# Patient Record
Sex: Male | Born: 1970 | Race: Black or African American | Hispanic: No | Marital: Single | Smoking: Current every day smoker
Health system: Southern US, Community
[De-identification: ages and names within clinical notes are randomized; demographics above are authoritative.]

## PROBLEM LIST (undated history)

## (undated) DIAGNOSIS — E119 Type 2 diabetes mellitus without complications: Secondary | ICD-10-CM

## (undated) DIAGNOSIS — E559 Vitamin D deficiency, unspecified: Secondary | ICD-10-CM

## (undated) DIAGNOSIS — G479 Sleep disorder, unspecified: Secondary | ICD-10-CM

## (undated) DIAGNOSIS — B20 Human immunodeficiency virus [HIV] disease: Secondary | ICD-10-CM

## (undated) DIAGNOSIS — I10 Essential (primary) hypertension: Secondary | ICD-10-CM

## (undated) DIAGNOSIS — E782 Mixed hyperlipidemia: Secondary | ICD-10-CM

## (undated) DIAGNOSIS — I509 Heart failure, unspecified: Secondary | ICD-10-CM

## (undated) DIAGNOSIS — I6502 Occlusion and stenosis of left vertebral artery: Secondary | ICD-10-CM

## (undated) DIAGNOSIS — E291 Testicular hypofunction: Secondary | ICD-10-CM

## (undated) HISTORY — DX: Human immunodeficiency virus (HIV) disease: B20

## (undated) HISTORY — DX: Heart failure, unspecified: I50.9

## (undated) HISTORY — DX: Mixed hyperlipidemia: E78.2

## (undated) HISTORY — DX: Testicular hypofunction: E29.1

## (undated) HISTORY — PX: CHOLECYSTECTOMY: SHX55

## (undated) HISTORY — DX: Essential (primary) hypertension: I10

## (undated) HISTORY — DX: Type 2 diabetes mellitus without complications: E11.9

## (undated) HISTORY — DX: Occlusion and stenosis of left vertebral artery: I65.02

## (undated) HISTORY — DX: Sleep disorder, unspecified: G47.9

## (undated) HISTORY — DX: Vitamin D deficiency, unspecified: E55.9

---

## 2020-07-26 ENCOUNTER — Other Ambulatory Visit: Payer: Self-pay

## 2020-07-26 DIAGNOSIS — B20 Human immunodeficiency virus [HIV] disease: Secondary | ICD-10-CM

## 2020-07-27 ENCOUNTER — Other Ambulatory Visit (HOSPITAL_COMMUNITY)
Admission: RE | Admit: 2020-07-27 | Discharge: 2020-07-27 | Disposition: A | Discharge status: Home / Self Care | Payer: No Typology Code available for payment source | Source: Ambulatory Visit | Attending: Internal Medicine | Admitting: Internal Medicine

## 2020-07-27 ENCOUNTER — Other Ambulatory Visit: Payer: Self-pay

## 2020-07-27 ENCOUNTER — Other Ambulatory Visit: Payer: No Typology Code available for payment source

## 2020-07-27 DIAGNOSIS — B20 Human immunodeficiency virus [HIV] disease: Secondary | ICD-10-CM | POA: Diagnosis present

## 2020-07-28 ENCOUNTER — Telehealth: Payer: Self-pay

## 2020-07-28 LAB — URINALYSIS
Bilirubin Urine: NEGATIVE
Hgb urine dipstick: NEGATIVE
Ketones, ur: NEGATIVE
Leukocytes,Ua: NEGATIVE
Nitrite: NEGATIVE
Specific Gravity, Urine: 1.03 (ref 1.001–1.03)
pH: 6 (ref 5.0–8.0)

## 2020-07-28 LAB — URINE CYTOLOGY ANCILLARY ONLY
Chlamydia: NEGATIVE
Comment: NEGATIVE
Comment: NORMAL
Neisseria Gonorrhea: NEGATIVE

## 2020-07-28 LAB — T-HELPER CELL (CD4) - (RCID CLINIC ONLY)
CD4 % Helper T Cell: 9 % — ABNORMAL LOW (ref 33–65)
CD4 T Cell Abs: 208 /uL — ABNORMAL LOW (ref 400–1790)

## 2020-07-28 NOTE — Telephone Encounter (Signed)
[facility] calling with critical lab values :  Cholesterol : 621 Triglyceride : 7,333  Glucose:  492  Patient has an appointment scheduled 08-10-2020  Please advise  Eloise Mula Gorden Harms, RN

## 2020-07-28 NOTE — Telephone Encounter (Signed)
I spoke with patient who states he had type two diabetes and takes Lantus insulin.  He is not having any symptoms other than being worried about his HIV since he has been without medications for months.  He recently moved to the area and had issues with transferring to our office.  He states he just took a new job and works from home and would not want to be admitted to the hospital for fear of missing work. Per Dr Renold Don , if patient is without symptoms a emergency visit is not needed but he will need to see a provider tomorrow.  Patient was advised to go immediately to emergency room if he started to have any abnormal symptoms or developed  vomiting, nausea, shortness of breath, confusion, frequent urination or increased glucose levels due to his risk of diabetic ketoacidosis .    Laurell Josephs, RN

## 2020-07-28 NOTE — Telephone Encounter (Deleted)
I spoke to

## 2020-07-29 ENCOUNTER — Telehealth: Payer: Self-pay

## 2020-07-29 ENCOUNTER — Telehealth: Payer: Self-pay | Admitting: *Deleted

## 2020-07-29 ENCOUNTER — Other Ambulatory Visit: Payer: Self-pay

## 2020-07-29 ENCOUNTER — Ambulatory Visit (INDEPENDENT_AMBULATORY_CARE_PROVIDER_SITE_OTHER): Payer: No Typology Code available for payment source | Admitting: Internal Medicine

## 2020-07-29 ENCOUNTER — Encounter: Payer: Self-pay | Admitting: Internal Medicine

## 2020-07-29 VITALS — BP 202/128 | HR 63 | Temp 98.5°F | Wt 184.0 lb

## 2020-07-29 DIAGNOSIS — B20 Human immunodeficiency virus [HIV] disease: Secondary | ICD-10-CM

## 2020-07-29 DIAGNOSIS — Z23 Encounter for immunization: Secondary | ICD-10-CM | POA: Diagnosis not present

## 2020-07-29 DIAGNOSIS — IMO0002 Reserved for concepts with insufficient information to code with codable children: Secondary | ICD-10-CM

## 2020-07-29 DIAGNOSIS — E1165 Type 2 diabetes mellitus with hyperglycemia: Secondary | ICD-10-CM

## 2020-07-29 DIAGNOSIS — E1129 Type 2 diabetes mellitus with other diabetic kidney complication: Secondary | ICD-10-CM

## 2020-07-29 HISTORY — DX: Human immunodeficiency virus (HIV) disease: B20

## 2020-07-29 MED ORDER — ATORVASTATIN CALCIUM 80 MG PO TABS: 80.0000 mg | ORAL_TABLET | Freq: Every day | ORAL | 1 refills | Status: DC

## 2020-07-29 MED ORDER — PENLET II REPLACEMENT CAP MISC: 1.0000 | Freq: Every day | 0 refills | Status: DC

## 2020-07-29 MED ORDER — LISINOPRIL 40 MG PO TABS: 40.0000 mg | ORAL_TABLET | Freq: Every day | ORAL | 1 refills | Status: DC

## 2020-07-29 MED ORDER — BICTEGRAVIR-EMTRICITAB-TENOFOV 50-200-25 MG PO TABS: 1.0000 | ORAL_TABLET | Freq: Every day | ORAL | 11 refills | Status: DC

## 2020-07-29 MED ORDER — FENOFIBRATE 160 MG PO TABS: 160.0000 mg | ORAL_TABLET | Freq: Every day | ORAL | 1 refills | Status: DC

## 2020-07-29 MED ORDER — CARVEDILOL 25 MG PO TABS: 25.0000 mg | ORAL_TABLET | Freq: Two times a day (BID) | ORAL | 1 refills | Status: AC

## 2020-07-29 MED ORDER — GLIPIZIDE 5 MG PO TABS: 5.0000 mg | ORAL_TABLET | Freq: Every day | ORAL | 1 refills | Status: DC

## 2020-07-29 NOTE — Telephone Encounter (Signed)
[facility]'s [facility]antus needle. RN spoke to pharmacist, relayed that per chart, Dr Luciana Axe was providing 1 time fill to bridge him to primary care. Pharmacist took verbal clarification of prescription over the phone, will get the equipment that the patient needs. Andree Coss, RN

## 2020-07-29 NOTE — Telephone Encounter (Signed)
[facility] Patient Advocate Encounter   Was successful in obtaining a [employer] copay card for Biktarvy.  This copay card will make the patients copay $0.00.  I have spoken with the patient.         Shalayah Beagley, CPhT [facility] Phone: [phone] Fax:  [phone]  

## 2020-07-29 NOTE — Progress Notes (Signed)
Patient ID: Kevin Burke, male    DOB: 1971/02/22, 50 y.o.   MRN: [id number]  Reason for visit: to establish care as a new patient with HIV  HPI:   Patient was first diagnosed in 2002.  He was tested as part of routine screening.  The CD4 count is 208, viral load pending.  There have been no associated symptoms.  He was diagnosed originally routinely with no symptoms at that time.  No history of OIs and denies any history of syphilis, GC or chlamydia.  He endorses MSM activity.  He has been on medication essentially since his diagnosis and most recently has been on Biktarvy.  He is unsure when he started that.  He has had no issues with Biktarvy and has remained on it.  His HIV antibody is positive.  His last visit to ID was in April 2021, reviewed after the visit.  His CD4 at that time was 202 and his viral load was 345 copies, which was similar to his previous check.  He believes his viral load has remained in that range and does not recall ever getting any intensification of his ARVs.  He previously was on Mepron for OI prophylaxis but was stopped at the April visit.  He endorses excellent compliance though also asked about Cabenuva to help him stay on medication.     He also has other significant medical problems and has not been to his PCP since April 2021.  He was diagnosed with DM2 2 years ago and has been on Lantus 18 units a day and his last Hgb A1c per the record was 13.  He previously took metformin but stopped due to GI upset.  He also has diastolic CHF, mitral regurgitation, CAD, hypertriglyceridemia (TG over 7,000 here), HTN, hyperlipidemia, pancreatic mass and is a smoker.  He has been on lisinopril but not taking his other medications.    PMH: HTN, CAD, CHF, hypertriglyceridemia, hyperlipidema, DM  Prior to Admission medications   Medication Sig Start Date End Date Taking? Authorizing Provider  atorvastatin (LIPITOR) 80 MG tablet Take 1 tablet (80 mg total) by mouth daily. 07/29/20  Yes  Aimi Essner, Belia Heman, MD  bictegravir-emtricitabine-tenofovir AF (BIKTARVY) 50-200-25 MG TABS tablet Take 1 tablet by mouth daily. 07/29/20  Yes Jaymere Alen, Belia Heman, MD  carvedilol (COREG) 25 MG tablet Take 1 tablet (25 mg total) by mouth 2 (two) times daily with a meal. 07/29/20  Yes Mccauley Diehl, Belia Heman, MD  fenofibrate 160 MG tablet Take 1 tablet (160 mg total) by mouth daily. 07/29/20  Yes Wyatt Thorstenson, Belia Heman, MD  glipiZIDE (GLUCOTROL) 5 MG tablet Take 1 tablet (5 mg total) by mouth daily before breakfast. 07/29/20  Yes Dollie Mayse, Belia Heman, MD  Lancets Misc. (PENLET II REPLACEMENT CAP) MISC 1 each by Does not apply route daily. 07/29/20  Yes Linzie Boursiquot, Belia Heman, MD  lisinopril (ZESTRIL) 40 MG tablet Take 1 tablet (40 mg total) by mouth daily. 07/29/20  Yes Kerwin Augustus, Belia Heman, MD    [facility]: mother with hyperlipidemia, HTN, CVA, MI   Review of Systems Constitutional: negative for sweats, fatigue, malaise and anorexia Respiratory: negative for cough or sputum Gastrointestinal: negative for nausea and diarrhea Integument/breast: negative for rash Musculoskeletal: negative for myalgias and arthralgias All other systems reviewed and are negative    CONSTITUTIONAL:in no apparent distress  Vitals:   07/29/20 1012  BP: (!) 202/128  Pulse: 63  Temp: 98.5 F (36.9 C)   EYES: anicteric HENT: no thrush CARD:Cor RRR RESP:clear  SH:FWYOV sounds are normal, liver is not enlarged, spleen is not enlarged MS:no pedal edema noted SKIN: no rash NEURO: non-focal  No results found for: HIV1RNAQUANT No components found for: HIV1GENOTYPRPLUS No components found for: THELPERCELL  Assessment: new patient here with established HIV.  Discussed with patient treatment options and side effects, benefits of treatment, long term outcomes.  I discussed the severity of untreated HIV including higher cancer risk, opportunistic infections, renal failure.  Also discussed needing to use condoms, partner disclosure, necessary vaccines, blood monitoring.   All questions answered.  He is interested in [place] and understands that with commercial insurance coverage is unlikely and will need to wait until later this year for [place] to consider it.  I will have him continue with Biktarvy for now.   He also has negative hepatitis A and B Abs and no record sent of previous vaccinations of these so will discuss at his next visit. For his other issues, I have refilled his medications until he is established with a PCP and he will look today.  I also will start him on glipizide to add to his lantus since he did not tolerate the metformin.     rtc in 3 weeks.

## 2020-07-31 LAB — HIV-1 RNA ULTRAQUANT REFLEX TO GENTYP+
HIV 1 RNA Quant: 41 copies/mL — ABNORMAL HIGH
HIV-1 RNA Quant, Log: 1.61 Log copies/mL — ABNORMAL HIGH

## 2020-07-31 LAB — CBC WITH DIFFERENTIAL/PLATELET
Absolute Monocytes: 244 cells/uL (ref 200–950)
Basophils Absolute: 18 cells/uL (ref 0–200)
Basophils Relative: 0.3 %
Eosinophils Absolute: 61 cells/uL (ref 15–500)
Eosinophils Relative: 1 %
HCT: 43.2 % (ref 38.5–50.0)
Hemoglobin: 14.5 g/dL (ref 13.2–17.1)
Lymphs Abs: 2721 cells/uL (ref 850–3900)
MCH: 26.7 pg — ABNORMAL LOW (ref 27.0–33.0)
MCHC: 33.6 g/dL (ref 32.0–36.0)
MCV: 79.6 fL — ABNORMAL LOW (ref 80.0–100.0)
MPV: 11.2 fL (ref 7.5–12.5)
Monocytes Relative: 4 %
Neutro Abs: 3056 cells/uL (ref 1500–7800)
Neutrophils Relative %: 50.1 %
Platelets: 239 10*3/uL (ref 140–400)
RBC: 5.43 10*6/uL (ref 4.20–5.80)
RDW: 16.3 % — ABNORMAL HIGH (ref 11.0–15.0)
Total Lymphocyte: 44.6 %
WBC: 6.1 10*3/uL (ref 3.8–10.8)

## 2020-07-31 LAB — QUANTIFERON-TB GOLD PLUS
Mitogen-NIL: >10 IU/mL
NIL: 0.06 IU/mL
QuantiFERON-TB Gold Plus: NEGATIVE
TB1-NIL: 0.03 IU/mL
TB2-NIL: 0.03 IU/mL

## 2020-07-31 LAB — COMPLETE METABOLIC PANEL WITH GFR
AG Ratio: 0.9 (calc) — ABNORMAL LOW (ref 1.0–2.5)
ALT: 17 U/L (ref 9–46)
AST: 21 U/L (ref 10–40)
Albumin: 3.9 g/dL (ref 3.6–5.1)
Alkaline phosphatase (APISO): 78 U/L (ref 36–130)
BUN/Creatinine Ratio: 13 (calc) (ref 6–22)
BUN: 18 mg/dL (ref 7–25)
CO2: 23 mmol/L (ref 20–32)
Calcium: 9.2 mg/dL (ref 8.6–10.3)
Chloride: 95 mmol/L — ABNORMAL LOW (ref 98–110)
Creat: 1.37 mg/dL — ABNORMAL HIGH (ref 0.60–1.35)
GFR, Est African American: 70 mL/min/{1.73_m2} (ref >=60)
GFR, Est Non African American: 60 mL/min/{1.73_m2} (ref >=60)
Globulin: 4.4 g/dL (calc) — ABNORMAL HIGH (ref 1.9–3.7)
Glucose, Bld: 492 mg/dL — ABNORMAL HIGH (ref 65–99)
Potassium: 5.2 mmol/L (ref 3.5–5.3)
Sodium: 131 mmol/L — ABNORMAL LOW (ref 135–146)
Total Bilirubin: 0.5 mg/dL (ref 0.2–1.2)
Total Protein: 8.3 g/dL — ABNORMAL HIGH (ref 6.1–8.1)

## 2020-07-31 LAB — LIPID PANEL
Cholesterol: 621 mg/dL — ABNORMAL HIGH (ref <200)
HDL: 24 mg/dL — ABNORMAL LOW (ref >=40)
Non-HDL Cholesterol (Calc): 597 mg/dL (calc) — ABNORMAL HIGH (ref <130)
Total CHOL/HDL Ratio: 25.9 (calc) — ABNORMAL HIGH (ref <5.0)
Triglycerides: 7333 mg/dL — ABNORMAL HIGH (ref <150)

## 2020-07-31 LAB — RPR

## 2020-07-31 LAB — HIV ANTIBODY (ROUTINE TESTING W REFLEX): HIV 1&2 Ab, 4th Generation: REACTIVE — AB

## 2020-07-31 LAB — HEPATITIS A ANTIBODY, TOTAL: Hepatitis A AB,Total: NONREACTIVE

## 2020-07-31 LAB — HEPATITIS C ANTIBODY
Hepatitis C Ab: NONREACTIVE
SIGNAL TO CUT-OFF: 0.04 (ref <1.00)

## 2020-07-31 LAB — HIV-1/2 AB - DIFFERENTIATION
HIV-1 antibody: POSITIVE — AB
HIV-2 Ab: NEGATIVE

## 2020-07-31 LAB — HLA B*5701: HLA-B*5701 w/rflx HLA-B High: NEGATIVE

## 2020-07-31 LAB — HEPATITIS B SURFACE ANTIGEN: Hepatitis B Surface Ag: NONREACTIVE

## 2020-07-31 LAB — HEPATITIS B SURFACE ANTIBODY,QUALITATIVE: Hep B S Ab: NONREACTIVE

## 2020-07-31 LAB — HEPATITIS B CORE ANTIBODY, TOTAL: Hep B Core Total Ab: NONREACTIVE

## 2020-08-01 ENCOUNTER — Telehealth: Payer: Self-pay

## 2020-08-01 NOTE — Telephone Encounter (Signed)
Patient is calling requesting a note for work for 07/28/20.  Patient states when he was called on 07/28/20 and advised to go to the ER he logged out of work and is needing a work note for that day.  Patient came in the office on 07/29/20 and was seen by a provider instead of going to the ER Patient was given a general return to work note on 07/28/20 when he came to his visit on 07/29/20.  Patient is now stating his manager will not accept the note that he received in our office because it is a handwritten letter.  Defne Gerling T Pricilla Loveless

## 2020-08-03 ENCOUNTER — Other Ambulatory Visit: Payer: Self-pay | Admitting: Internal Medicine

## 2020-08-03 NOTE — Progress Notes (Signed)
Outpatient labs reviewed;   3/03  Lipid 621/24/7333/ldl not calculated due to triglyceride Cr 1.4; lft wnl Cbc 6/14/239; mcv 79  hla b5701 negative  quantiferon gold negative  rpr negative Hep b c Ab, sAb, s Ag negative Hep a ab negative Hep c ab negative   hiv rna 41; hiv ab reactive

## 2020-08-10 ENCOUNTER — Encounter: Payer: Self-pay | Admitting: Internal Medicine

## 2020-08-17 ENCOUNTER — Other Ambulatory Visit: Payer: Self-pay

## 2020-08-17 ENCOUNTER — Ambulatory Visit (INDEPENDENT_AMBULATORY_CARE_PROVIDER_SITE_OTHER): Payer: No Typology Code available for payment source | Admitting: Internal Medicine

## 2020-08-17 ENCOUNTER — Encounter: Payer: Self-pay | Admitting: Internal Medicine

## 2020-08-17 VITALS — BP 154/97 | HR 69 | Wt 188.0 lb

## 2020-08-17 DIAGNOSIS — I152 Hypertension secondary to endocrine disorders: Secondary | ICD-10-CM | POA: Insufficient documentation

## 2020-08-17 DIAGNOSIS — I10 Essential (primary) hypertension: Secondary | ICD-10-CM

## 2020-08-17 DIAGNOSIS — E1165 Type 2 diabetes mellitus with hyperglycemia: Secondary | ICD-10-CM

## 2020-08-17 DIAGNOSIS — Z23 Encounter for immunization: Secondary | ICD-10-CM

## 2020-08-17 DIAGNOSIS — E1129 Type 2 diabetes mellitus with other diabetic kidney complication: Secondary | ICD-10-CM | POA: Diagnosis not present

## 2020-08-17 DIAGNOSIS — B20 Human immunodeficiency virus [HIV] disease: Secondary | ICD-10-CM

## 2020-08-17 DIAGNOSIS — IMO0002 Reserved for concepts with insufficient information to code with codable children: Secondary | ICD-10-CM

## 2020-08-17 DIAGNOSIS — E781 Pure hyperglyceridemia: Secondary | ICD-10-CM

## 2020-08-17 HISTORY — DX: Essential (primary) hypertension: I10

## 2020-08-17 NOTE — Progress Notes (Signed)
   Subjective:    Patient ID: Kevin Burke, male    DOB: 07-06-1970, 50 y.o.   MRN: [id number]  HPI He is here for a follow up visit. He was seen as a new patient earlier this month with a history of known HIV on Biktarvy.  He had continued this and his CD4 was 208 and viral load just 41 copies.  This is similar to previous or even a little improved.  He also has multiple issues including diabetes, CHF, HTN, was a smoker and with severe hypertriglyceridemia and had been off all of his medications other than the Biktarvy.  He is here today and back on his medications and feeling better.  BP noted and improved.  No chest pain.  He has an appointment for a new PCP next month.    Review of Systems  Constitutional: Negative for fatigue.  Gastrointestinal: Negative for diarrhea and nausea.  Skin: Positive for rash.       Objective:   Physical Exam Eyes:     General: No scleral icterus. Cardiovascular:     Rate and Rhythm: Normal rate and regular rhythm.  Pulmonary:     Effort: Pulmonary effort is normal.  Neurological:     General: No focal deficit present.     Mental Status: He is alert.  Psychiatric:        Mood and Affect: Mood normal.   SH: quit smoking two weeks ago       Assessment & Plan:

## 2020-08-18 ENCOUNTER — Encounter: Payer: Self-pay | Admitting: Internal Medicine

## 2020-08-18 DIAGNOSIS — E781 Pure hyperglyceridemia: Secondary | ICD-10-CM | POA: Insufficient documentation

## 2020-08-18 HISTORY — DX: Pure hyperglyceridemia: E78.1

## 2020-08-18 NOTE — Assessment & Plan Note (Signed)
From and HIV standpoint, he is doing well with his suppressed virus.  He will continue with Biktarvy and rtc in 6 months.

## 2020-08-18 NOTE — Assessment & Plan Note (Signed)
He is back on his appropriate medicaiton and further management going forward per his PCP

## 2020-08-18 NOTE — Assessment & Plan Note (Signed)
He is back on his medication and will defer further monitoring to his PCP.

## 2020-08-18 NOTE — Assessment & Plan Note (Signed)
His BP is much improved after refilling his medications last visit.  He has an appointment with his new PCP next month to continue management.

## 2020-08-20 ENCOUNTER — Other Ambulatory Visit: Payer: Self-pay | Admitting: Internal Medicine

## 2020-08-29 ENCOUNTER — Encounter: Payer: Self-pay | Admitting: Internal Medicine

## 2020-09-21 ENCOUNTER — Other Ambulatory Visit: Payer: Self-pay | Admitting: Internal Medicine

## 2020-09-25 ENCOUNTER — Other Ambulatory Visit: Payer: Self-pay | Admitting: Internal Medicine

## 2020-09-30 ENCOUNTER — Other Ambulatory Visit: Payer: Self-pay | Admitting: Internal Medicine

## 2020-10-10 ENCOUNTER — Other Ambulatory Visit: Payer: Self-pay | Admitting: Internal Medicine

## 2020-10-16 ENCOUNTER — Other Ambulatory Visit: Payer: Self-pay | Admitting: Internal Medicine

## 2020-10-21 ENCOUNTER — Other Ambulatory Visit: Payer: Self-pay | Admitting: Internal Medicine

## 2020-10-22 ENCOUNTER — Other Ambulatory Visit: Payer: Self-pay | Admitting: Internal Medicine

## 2020-10-28 ENCOUNTER — Other Ambulatory Visit: Payer: Self-pay | Admitting: Internal Medicine

## 2020-11-15 ENCOUNTER — Encounter: Payer: Self-pay | Admitting: *Deleted

## 2020-11-16 ENCOUNTER — Ambulatory Visit: Payer: No Typology Code available for payment source | Admitting: Cardiology

## 2020-11-16 NOTE — Progress Notes (Deleted)
     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 MR improved with bp control - cannot find echo report, clinic note mentions LVEF 45-50%     2. HTN - side effects on norvasc    3. HIV  4. Hyperlipidemia Past Medical History:  Diagnosis Date   HIV (human immunodeficiency virus infection) (HCC)    Hypertension    Type 2 diabetes mellitus (HCC)      No Known Allergies   Current Outpatient Medications  Medication Sig Dispense Refill   atorvastatin (LIPITOR) 80 MG tablet TAKE 1 TABLET BY MOUTH EVERY DAY 30 tablet 1   bictegravir-emtricitabine-tenofovir AF (BIKTARVY) 50-200-25 MG TABS tablet Take 1 tablet by mouth daily. 30 tablet 11   carvedilol (COREG) 25 MG tablet Take 1 tablet (25 mg total) by mouth 2 (two) times daily with a meal. 60 tablet 1   fenofibrate 160 MG tablet Take 1 tablet (160 mg total) by mouth daily. 30 tablet 1   glipiZIDE (GLUCOTROL) 5 MG tablet Take 1 tablet (5 mg total) by mouth daily before breakfast. 30 tablet 1   Lancets Misc. (PENLET II REPLACEMENT CAP) MISC 1 each by Does not apply route daily. 30 each 0   lisinopril (ZESTRIL) 40 MG tablet Take 1 tablet (40 mg total) by mouth daily. 30 tablet 1   No current facility-administered medications for this visit.     Past Surgical History:  Procedure Laterality Date   CHOLECYSTECTOMY     HERNIA REPAIR  2018     No Known Allergies    Family History  Adopted: Yes     Social History Mr. Waterhouse reports that he has quit smoking. His smoking use included cigarettes. He has never used smokeless tobacco. Mr. Trulson has no history on file for alcohol use.   Review of Systems CONSTITUTIONAL: No weight loss, fever, chills, weakness or fatigue.  HEENT: Eyes: No visual loss, blurred vision, double vision or yellow sclerae.No hearing loss, sneezing,  congestion, runny nose or sore throat.  SKIN: No rash or itching.  CARDIOVASCULAR:  RESPIRATORY: No shortness of breath, cough or sputum.  GASTROINTESTINAL: No anorexia, nausea, vomiting or diarrhea. No abdominal pain or blood.  GENITOURINARY: No burning on urination, no polyuria NEUROLOGICAL: No headache, dizziness, syncope, paralysis, ataxia, numbness or tingling in the extremities. No change in bowel or bladder control.  MUSCULOSKELETAL: No muscle, back pain, joint pain or stiffness.  LYMPHATICS: No enlarged nodes. No history of splenectomy.  PSYCHIATRIC: No history of depression or anxiety.  ENDOCRINOLOGIC: No reports of sweating, cold or heat intolerance. No polyuria or polydipsia.  Marland Kitchen   Physical Examination There were no vitals filed for this visit. There were no vitals filed for this visit.  Gen: resting comfortably, no acute distress HEENT: no scleral icterus, pupils equal round and reactive, no palptable cervical adenopathy,  CV Resp: Clear to auscultation bilaterally GI: abdomen is soft, non-tender, non-distended, normal bowel sounds, no hepatosplenomegaly MSK: extremities are warm, no edema.  Skin: warm, no rash Neuro:  no focal deficits Psych: appropriate affect   Diagnostic Studies     Assessment and Plan        Antoine Poche, M.D., F.A.C.C.

## 2020-11-28 ENCOUNTER — Other Ambulatory Visit: Payer: Self-pay | Admitting: Internal Medicine

## 2020-12-21 ENCOUNTER — Telehealth: Payer: Self-pay

## 2020-12-21 NOTE — Telephone Encounter (Signed)
[facility] stating that they have been unable to reach the patient in regards to confirming medication refills. [facility] one listed in patient's chart.   Attempted to call patient, no answer and mailbox is full. Will send MyChart message.   Sandie Ano, RN

## 2021-01-19 ENCOUNTER — Other Ambulatory Visit: Payer: Self-pay | Admitting: Internal Medicine

## 2021-02-17 ENCOUNTER — Other Ambulatory Visit: Payer: No Typology Code available for payment source

## 2021-03-06 ENCOUNTER — Telehealth: Payer: Self-pay

## 2021-03-06 ENCOUNTER — Other Ambulatory Visit (HOSPITAL_COMMUNITY): Payer: Self-pay

## 2021-03-06 ENCOUNTER — Encounter: Payer: No Typology Code available for payment source | Admitting: Internal Medicine

## 2021-03-06 NOTE — Telephone Encounter (Signed)
[facility] Patient Advocate Encounter   Was successful in obtaining a [employer] copay card for Biktarvy.  This copay card will make the patients copay $0.00.  I have spoken with the patient.         Gene Glazebrook, CPhT [facility]acy [facility] Phone: [phone] Fax:  [phone]  

## 2021-06-05 ENCOUNTER — Other Ambulatory Visit: Payer: Self-pay

## 2021-06-05 ENCOUNTER — Other Ambulatory Visit: Payer: 59

## 2021-06-05 DIAGNOSIS — B20 Human immunodeficiency virus [HIV] disease: Secondary | ICD-10-CM

## 2021-06-06 LAB — T-HELPER CELL (CD4) - (RCID CLINIC ONLY)
CD4 % Helper T Cell: 5 % — ABNORMAL LOW (ref 33–65)
CD4 T Cell Abs: 175 /uL — ABNORMAL LOW (ref 400–1790)

## 2021-06-07 LAB — HIV-1 RNA QUANT-NO REFLEX-BLD
HIV 1 RNA Quant: 1780 Copies/mL — ABNORMAL HIGH
HIV-1 RNA Quant, Log: 3.25 Log cps/mL — ABNORMAL HIGH

## 2021-06-08 ENCOUNTER — Other Ambulatory Visit: Payer: 59

## 2021-06-08 ENCOUNTER — Telehealth: Payer: Self-pay

## 2021-06-08 ENCOUNTER — Other Ambulatory Visit: Payer: Self-pay | Admitting: Internal Medicine

## 2021-06-08 ENCOUNTER — Other Ambulatory Visit: Payer: Self-pay

## 2021-06-08 DIAGNOSIS — B20 Human immunodeficiency virus [HIV] disease: Secondary | ICD-10-CM

## 2021-06-08 NOTE — Telephone Encounter (Signed)
[facility] notified to add genotype to order per Dr. Linus Salmons.   Beryle Flock, RN

## 2021-06-08 NOTE — Telephone Encounter (Signed)
-----   Message from Gardiner Barefoot, MD sent at 06/08/2021  9:00 AM EST ----- Can a genotype be added to this sample for resistance? thanks

## 2021-06-23 ENCOUNTER — Encounter: Payer: No Typology Code available for payment source | Admitting: Internal Medicine

## 2021-06-26 LAB — HIV-1 GENOTYPING (RTI,PI,IN INHBTR): HIV-1 Genotype: DETECTED — AB

## 2021-07-12 ENCOUNTER — Encounter: Payer: Self-pay | Admitting: Internal Medicine

## 2021-07-12 ENCOUNTER — Ambulatory Visit (INDEPENDENT_AMBULATORY_CARE_PROVIDER_SITE_OTHER): Payer: 59 | Admitting: Internal Medicine

## 2021-07-12 ENCOUNTER — Other Ambulatory Visit: Payer: Self-pay

## 2021-07-12 VITALS — BP 134/87 | HR 96 | Resp 16 | Ht 71.0 in | Wt 176.0 lb

## 2021-07-12 DIAGNOSIS — Z23 Encounter for immunization: Secondary | ICD-10-CM | POA: Diagnosis not present

## 2021-07-12 DIAGNOSIS — Z9189 Other specified personal risk factors, not elsewhere classified: Secondary | ICD-10-CM | POA: Diagnosis not present

## 2021-07-12 DIAGNOSIS — Z113 Encounter for screening for infections with a predominantly sexual mode of transmission: Secondary | ICD-10-CM | POA: Insufficient documentation

## 2021-07-12 DIAGNOSIS — B20 Human immunodeficiency virus [HIV] disease: Secondary | ICD-10-CM | POA: Diagnosis not present

## 2021-07-12 HISTORY — DX: Encounter for screening for infections with a predominantly sexual mode of transmission: Z11.3

## 2021-07-12 MED ORDER — HEPATITIS B VAC RECOMB ADJ 20 MCG/0.5ML IM SOSY: 0.5000 mL | PREFILLED_SYRINGE | Freq: Once | INTRAMUSCULAR | 0 refills | Status: AC

## 2021-07-12 NOTE — Progress Notes (Signed)
° °  Subjective:    Patient ID: Kevin Burke, male    DOB: 1971/04/24, 51 y.o.   MRN: [id number]  HPI Here for follow up of HIV He was seen about 1 year ago after getting back in care and on Biktarvy.  He has not followed up since that time.  CD4 now down to 175 and viral load 1780.  No significant resistance mutations noted on genotype.  Restarted his medications in early January and labs done shortly after that.  He is doing well on his other medications for heart failure, blood pressure.     Review of Systems  Constitutional:  Negative for fatigue.  Gastrointestinal:  Negative for diarrhea and nausea.  Skin:  Negative for rash.      Objective:   Physical Exam Pulmonary:     Effort: Pulmonary effort is normal.  Skin:    Findings: No rash.  Neurological:     General: No focal deficit present.     Mental Status: He is alert.  Psychiatric:        Mood and Affect: Mood normal.   SH: no current tobacco       Assessment & Plan:

## 2021-07-12 NOTE — Assessment & Plan Note (Signed)
CD4 low and under 200.  Will recheck again today and if it remains low, will consider having him start Bactrim prophylaxis, though if rising and now back on treatment, will not be absolutely indicated.

## 2021-07-12 NOTE — Assessment & Plan Note (Signed)
Will screen 

## 2021-07-12 NOTE — Assessment & Plan Note (Signed)
He is back on Biktarvy and I will recheck his labs today to see if his viral load is back to suppressed.  He is having no concerns related to HIV  Will return in 2 months

## 2021-07-14 LAB — RPR: RPR Ser Ql: NONREACTIVE

## 2021-07-14 LAB — HIV-1 RNA QUANT-NO REFLEX-BLD
HIV 1 RNA Quant: 54 Copies/mL — ABNORMAL HIGH
HIV-1 RNA Quant, Log: 1.73 Log cps/mL — ABNORMAL HIGH

## 2021-07-14 LAB — T-HELPER CELLS (CD4) COUNT (NOT AT ARMC)
Absolute CD4: 207 cells/uL — ABNORMAL LOW (ref 490–1740)
CD4 T Helper %: 8 % — ABNORMAL LOW (ref 30–61)
Total lymphocyte count: 2700 cells/uL (ref 850–3900)

## 2021-08-27 ENCOUNTER — Encounter (HOSPITAL_COMMUNITY): Payer: Self-pay | Admitting: Emergency Medicine

## 2021-08-27 ENCOUNTER — Emergency Department (HOSPITAL_COMMUNITY)
Admission: EM | Admit: 2021-08-27 | Discharge: 2021-08-28 | Disposition: A | Discharge status: Home / Self Care | Payer: 59 | Attending: Emergency Medicine | Admitting: Emergency Medicine

## 2021-08-27 DIAGNOSIS — Z79899 Other long term (current) drug therapy: Secondary | ICD-10-CM | POA: Insufficient documentation

## 2021-08-27 DIAGNOSIS — Z7984 Long term (current) use of oral hypoglycemic drugs: Secondary | ICD-10-CM | POA: Insufficient documentation

## 2021-08-27 DIAGNOSIS — R519 Headache, unspecified: Secondary | ICD-10-CM

## 2021-08-27 DIAGNOSIS — I10 Essential (primary) hypertension: Secondary | ICD-10-CM | POA: Diagnosis not present

## 2021-08-27 DIAGNOSIS — I6502 Occlusion and stenosis of left vertebral artery: Secondary | ICD-10-CM | POA: Diagnosis not present

## 2021-08-27 MED ORDER — LABETALOL HCL 5 MG/ML IV SOLN
10.0000 mg | Freq: Once | INTRAVENOUS | Status: AC
  Administered 2021-08-28: 10 mg via INTRAVENOUS
  Filled 2021-08-27: qty 4

## 2021-08-27 NOTE — ED Triage Notes (Signed)
Pt c/o headache since yesterday. Pt has taken Excedrin and tylenol with no relief. Pt denies hx of headaches.  ?

## 2021-08-27 NOTE — ED Provider Notes (Signed)
?[facility] ?Provider Note ? ? ?CSN: [id number] ?Arrival date & time: 08/27/21  2323 ? ?  ? ?History ? ?Chief Complaint  ?Patient presents with  ? Headache  ? ? ?Kevin Burke is a 51 y.o. male. ? ?Patient presents to the emergency department for headache.  Patient reports that he has had a left posterior headache since awakening yesterday.  Headache has not changed since yesterday but it has been persistent despite taking Excedrin.  Patient reports that this is unusual for him, he does not normally get headaches.  He has not noticed any numbness, tingling, weakness of any extremities or any focal neurologic symptoms. ? ? ?  ? ?Home Medications ?Prior to Admission medications   ?Medication Sig Start Date End Date Taking? Authorizing Provider  ?atorvastatin (LIPITOR) 80 MG tablet TAKE 1 TABLET BY MOUTH EVERY DAY 09/21/20   Comer, Okey Regal, MD  ?bictegravir-emtricitabine-tenofovir AF (BIKTARVY) 50-200-25 MG TABS tablet Take 1 tablet by mouth daily. 07/29/20   Thayer Headings, MD  ?carvedilol (COREG) 25 MG tablet Take 1 tablet (25 mg total) by mouth 2 (two) times daily with a meal. 07/29/20   Comer, Okey Regal, MD  ?fenofibrate 160 MG tablet Take 1 tablet (160 mg total) by mouth daily. 07/29/20   Thayer Headings, MD  ?glipiZIDE (GLUCOTROL) 5 MG tablet Take 1 tablet (5 mg total) by mouth daily before breakfast. 07/29/20   Comer, Okey Regal, MD  ?Lancets Misc. (PENLET II REPLACEMENT CAP) MISC 1 each by Does not apply route daily. 07/29/20   Thayer Headings, MD  ?lisinopril (ZESTRIL) 40 MG tablet Take 1 tablet (40 mg total) by mouth daily. 07/29/20   Thayer Headings, MD  ?   ? ?Allergies    ?Patient has no known allergies.   ? ?Review of Systems   ?Review of Systems  ?Neurological:  Positive for headaches.  ? ?Physical Exam ?Updated Vital Signs ?BP (!) 174/122   Pulse 77   Temp 98.5 ?F (36.9 ?C) (Oral)   Resp 16   Ht 5\' 6"  (1.676 m)   Wt 80.3 kg   SpO2 92%   BMI 28.57 kg/m?  ?Physical Exam ?Vitals and nursing note  reviewed.  ?Constitutional:   ?   General: He is not in acute distress. ?   Appearance: He is well-developed.  ?HENT:  ?   Head: Normocephalic and atraumatic.  ?   Mouth/Throat:  ?   Mouth: Mucous membranes are moist.  ?Eyes:  ?   General: Vision grossly intact. Gaze aligned appropriately.  ?   Extraocular Movements: Extraocular movements intact.  ?   Conjunctiva/sclera: Conjunctivae normal.  ?Cardiovascular:  ?   Rate and Rhythm: Normal rate and regular rhythm.  ?   Pulses: Normal pulses.  ?   Heart sounds: Normal heart sounds, S1 normal and S2 normal. No murmur heard. ?  No friction rub. No gallop.  ?Pulmonary:  ?   Effort: Pulmonary effort is normal. No respiratory distress.  ?   Breath sounds: Normal breath sounds.  ?Abdominal:  ?   Palpations: Abdomen is soft.  ?   Tenderness: There is no abdominal tenderness. There is no guarding or rebound.  ?   Hernia: No hernia is present.  ?Musculoskeletal:     ?   General: No swelling.  ?   Cervical back: Full passive range of motion without pain, normal range of motion and neck supple. No pain with movement, spinous process tenderness or muscular tenderness.  Normal range of motion.  ?   Right lower leg: No edema.  ?   Left lower leg: No edema.  ?Skin: ?   General: Skin is warm and dry.  ?   Capillary Refill: Capillary refill takes less than 2 seconds.  ?   Findings: No ecchymosis, erythema, lesion or wound.  ?Neurological:  ?   Mental Status: He is alert and oriented to person, place, and time.  ?   GCS: GCS eye subscore is 4. GCS verbal subscore is 5. GCS motor subscore is 6.  ?   Cranial Nerves: Cranial nerves 2-12 are intact.  ?   Sensory: Sensation is intact.  ?   Motor: Motor function is intact. No weakness or abnormal muscle tone.  ?   Coordination: Coordination is intact.  ?Psychiatric:     ?   Mood and Affect: Mood normal.     ?   Speech: Speech normal.     ?   Behavior: Behavior normal.  ? ? ?ED Results / Procedures / Treatments   ?Labs ?(all labs ordered are  listed, but only abnormal results are displayed) ?Labs Reviewed  ?CBC WITH DIFFERENTIAL/PLATELET - Abnormal; Notable for the following components:  ?    Result Value  ? Hemoglobin 12.7 (*)   ? HCT 35.5 (*)   ? MCV 77.7 (*)   ? RDW 15.6 (*)   ? All other components within normal limits  ?BASIC METABOLIC PANEL - Abnormal; Notable for the following components:  ? Sodium 129 (*)   ? Glucose, Bld 275 (*)   ? Creatinine, Ser 1.34 (*)   ? Calcium 8.3 (*)   ? All other components within normal limits  ? ? ?EKG ?EKG Interpretation ? ?Date/Time:  Monday August 28 2021 01:05:20 EDT ?Ventricular Rate:  69 ?PR Interval:  165 ?QRS Duration: 106 ?QT Interval:  421 ?QTC Calculation: 451 ?R Axis:   30 ?Text Interpretation: Sinus rhythm LVH with secondary repolarization abnormality Anterior ST elevation, probably due to LVH Confirmed by Gilda Crease [phone]) on 08/28/2021 3:03:47 AM ? ?Radiology ?CT Angio Head W or Wo Contrast ? ?Result Date: 08/28/2021 ?CLINICAL DATA:  Initial evaluation for acute headache.  She/ EXAM: CT ANGIOGRAPHY HEAD TECHNIQUE: Multidetector CT imaging of the head was performed using the standard protocol during bolus administration of intravenous contrast. Multiplanar CT image reconstructions and MIPs were obtained to evaluate the vascular anatomy. RADIATION DOSE REDUCTION: This exam was performed according to the departmental dose-optimization program which includes automated exposure control, adjustment of the mA and/or kV according to patient size and/or use of iterative reconstruction technique. CONTRAST:  22mL OMNIPAQUE IOHEXOL 350 MG/ML SOLN COMPARISON:  None available. FINDINGS: CT HEAD Brain: Cerebral volume within normal limits for patient age. No evidence for acute intracranial hemorrhage. No findings to suggest acute large vessel territory infarct. No mass lesion, midline shift, or mass effect. Ventricles are normal in size without evidence for hydrocephalus. No extra-axial fluid collection  identified. Vascular: No hyperdense vessel identified. Skull: Scalp soft tissues demonstrate no acute abnormality. Calvarium intact. Sinuses/Orbits: Globes and orbital soft tissues within normal limits. Visualized paranasal sinuses are clear. No mastoid effusion. CTA HEAD Anterior circulation: Distal cervical segments of the internal carotid arteries are widely patent bilaterally. Petrous segments widely patent. Atheromatous irregularity within the right carotid siphon without significant stenosis. Atheromatous change within the left carotid siphon with associated tandem severe segmental stenoses (series 10, image 28). A1 segments patent. Normal anterior communicating artery complex. Anterior cerebral  arteries widely patent without stenosis. Normal in stenosis or occlusion. Normal MCA bifurcations. Distal MCA branches perfused and symmetric. Posterior circulation: Right vertebral artery dominant and patent to the vertebrobasilar junction without stenosis. Right PICA patent. Scant flow noted within the visualized left V3 segment. Left vertebral artery occluded as it courses into the cranial vault. Retrograde filling of the distal left V4 segment across the vertebrobasilar junction with perfusion of the left PICA. Basilar patent without stenosis. Superior cerebellar arteries patent bilaterally. Both PCAs primarily supplied via the basilar well perfused or distal aspects. Small right posterior paired artery noted. Venous sinuses: Grossly patent allowing for timing the contrast bolus. Anatomic variants: As above. No intracranial aneurysm or other vascular malformation. Review of the MIP images confirms the above findings. IMPRESSION: 1. No acute intracranial abnormality. 2. 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 3. Atheromatous change within the left carotid siphon with associated tandem  severe segmental stenoses. 4. Otherwise wide patency of the major arterial vasculature of the head. No other large vessel occlusion or other emergent finding. No intracranial aneurysm. Electronically Signed   By: Vara Guardian

## 2021-08-28 ENCOUNTER — Emergency Department (HOSPITAL_COMMUNITY): Payer: 59

## 2021-08-28 LAB — CBC WITH DIFFERENTIAL/PLATELET
Abs Immature Granulocytes: 0.04 10*3/uL (ref 0.00–0.07)
Basophils Absolute: 0 10*3/uL (ref 0.0–0.1)
Basophils Relative: 0 %
Eosinophils Absolute: 0.1 10*3/uL (ref 0.0–0.5)
Eosinophils Relative: 2 %
HCT: 35.5 % — ABNORMAL LOW (ref 39.0–52.0)
Hemoglobin: 12.7 g/dL — ABNORMAL LOW (ref 13.0–17.0)
Immature Granulocytes: 1 %
Lymphocytes Relative: 48 %
Lymphs Abs: 3.7 10*3/uL (ref 0.7–4.0)
MCH: 27.8 pg (ref 26.0–34.0)
MCHC: 35.8 g/dL (ref 30.0–36.0)
MCV: 77.7 fL — ABNORMAL LOW (ref 80.0–100.0)
Monocytes Absolute: 0.4 10*3/uL (ref 0.1–1.0)
Monocytes Relative: 5 %
Neutro Abs: 3.3 10*3/uL (ref 1.7–7.7)
Neutrophils Relative %: 44 %
Platelets: 252 10*3/uL (ref 150–400)
RBC: 4.57 MIL/uL (ref 4.22–5.81)
RDW: 15.6 % — ABNORMAL HIGH (ref 11.5–15.5)
WBC: 7.6 10*3/uL (ref 4.0–10.5)
nRBC: 0 % (ref 0.0–0.2)

## 2021-08-28 LAB — BASIC METABOLIC PANEL
Anion gap: 7 (ref 5–15)
BUN: 19 mg/dL (ref 6–20)
CO2: 22 mmol/L (ref 22–32)
Calcium: 8.3 mg/dL — ABNORMAL LOW (ref 8.9–10.3)
Chloride: 100 mmol/L (ref 98–111)
Creatinine, Ser: 1.34 mg/dL — ABNORMAL HIGH (ref 0.61–1.24)
GFR, Estimated: >60 mL/min (ref >60)
Glucose, Bld: 275 mg/dL — ABNORMAL HIGH (ref 70–99)
Potassium: 3.8 mmol/L (ref 3.5–5.1)
Sodium: 129 mmol/L — ABNORMAL LOW (ref 135–145)

## 2021-08-28 MED ORDER — KETOROLAC TROMETHAMINE 30 MG/ML IJ SOLN
15.0000 mg | Freq: Once | INTRAMUSCULAR | Status: AC
  Administered 2021-08-28: 15 mg via INTRAVENOUS
  Filled 2021-08-28: qty 1

## 2021-08-28 MED ORDER — IOHEXOL 350 MG/ML SOLN
75.0000 mL | Freq: Once | INTRAVENOUS | Status: AC | PRN
  Administered 2021-08-28: 75 mL via INTRAVENOUS

## 2021-08-28 NOTE — Discharge Instructions (Addendum)
One of the arteries in the area of your neck is blocked.  This is not related to your symptoms today but does need to be evaluated by a vascular surgeon.  A referral has been placed. ?

## 2021-09-04 NOTE — Progress Notes (Signed)
? ?NEUROLOGY CONSULTATION NOTE ? ?Kevin Burke ?MRN: [id number] ?DOB: 08-15-1970 ? ?Referring provider: Eleanora Neighbor, MD ?Primary care provider: Georgann Housekeeper, MD ? ?Reason for consult:  headache, intracranial stenosis ? ?Assessment/Plan:  ? ?Left sided headache - unclear etiology.  May be related to his elevated blood pressure.  Unrelated to the vertebral artery occlusion.   ?Left intracranial vertebral artery occlusion ?Hypertension ?Hyperlipidemia ?Type 2 diabetes mellitus ? ?1  Recommend starting ASA 81mg  daily and continue management of stroke risk factors (DM II, HLD, HTN) - follow up with PCP regarding BP ?2  Check MRI of brain without contrast ?3  Low suspicion but will check sed rate and CRP to evaluate for GCA ?4  Given evidence of intracranial vascular occlusion, will check extracranial vessels (ICAs and VAs) with carotid ultrasound ?5  Limit use of pain relievers to no more than 2 days out of week to prevent risk of rebound or medication-overuse headache.  If headaches persist or worsen, he should contact and we can start a headache preventative  ?6  Follow up 4 months. ? ? ?Subjective:  ?Kevin Burke is a 51 year old male with CHF, HIV, HTN, DM II and hypogonadism who presents for headache and intracranial stenosis.  History supplemented by referring provider's note.   ? ?On 08/26/2021 he woke up with left sided throbbing headache.  No associated visual disturbance, nausea, photophobia, phonophobia, dizziness or unilateral numbness or weakness.  Does not normally get headaches.  As symptoms were persistent, he went to the ED the following day.  CT/CTA of head on 08/28/2021 personally reviewed showed left vertebral artery occlusion with retrograde filling of the distal left V4 segment across the vertebrobasilar junction.  No aneurysms.  Still has the headache.  Not as severe, now mild, intermittent but still day.  Takes Excedrin daily.  No neck pain ? ?Medications include:  Biktarvy, lisinopril,  amlodipine, carvidelol, furosemide, testosterone, glipizide ? ? ?PAST MEDICAL HISTORY: ?Past Medical History:  ?Diagnosis Date  ? HIV (human immunodeficiency virus infection) (HCC)   ? Hypertension   ? Type 2 diabetes mellitus (HCC)   ? ? ?PAST SURGICAL HISTORY: ?Past Surgical History:  ?Procedure Laterality Date  ? CHOLECYSTECTOMY    ? HERNIA REPAIR  2018  ? ? ?MEDICATIONS: ?Current Outpatient Medications on File Prior to Visit  ?Medication Sig Dispense Refill  ? atorvastatin (LIPITOR) 80 MG tablet TAKE 1 TABLET BY MOUTH EVERY DAY 30 tablet 1  ? bictegravir-emtricitabine-tenofovir AF (BIKTARVY) 50-200-25 MG TABS tablet Take 1 tablet by mouth daily. 30 tablet 11  ? carvedilol (COREG) 25 MG tablet Take 1 tablet (25 mg total) by mouth 2 (two) times daily with a meal. 60 tablet 1  ? fenofibrate 160 MG tablet Take 1 tablet (160 mg total) by mouth daily. 30 tablet 1  ? glipiZIDE (GLUCOTROL) 5 MG tablet Take 1 tablet (5 mg total) by mouth daily before breakfast. 30 tablet 1  ? Lancets Misc. (PENLET II REPLACEMENT CAP) MISC 1 each by Does not apply route daily. 30 each 0  ? lisinopril (ZESTRIL) 40 MG tablet Take 1 tablet (40 mg total) by mouth daily. 30 tablet 1  ? ?No current facility-administered medications on file prior to visit.  ? ? ?ALLERGIES: ?No Known Allergies ? ?FAMILY HISTORY: ?Family History  ?Adopted: Yes  ? ? ?Objective:  ?Blood pressure (!) 174/97, pulse 81, height 5\' 6"  (1.676 m), weight 178 lb 9.6 oz (81 kg), SpO2 97 %. ?General: No acute distress.  Patient  appears well-groomed.   ?Head:  Normocephalic/atraumatic ?Eyes:  fundi examined but not visualized ?Neck: supple, no paraspinal tenderness, full range of motion ?Back: No paraspinal tenderness ?Heart: regular rate and rhythm ?Lungs: Clear to auscultation bilaterally. ?Vascular: No carotid bruits. ?Neurological Exam: ?Mental status: alert and oriented to person, place, and time, recent and remote memory intact, fund of knowledge intact, attention and  concentration intact, speech fluent and not dysarthric, language intact. ?Cranial nerves: ?CN I: not tested ?CN II: pupils equal, round and reactive to light, visual fields intact ?CN III, IV, VI:  full range of motion, no nystagmus, no ptosis ?CN V: facial sensation intact. ?CN VII: upper and lower face symmetric ?CN VIII: hearing intact ?CN IX, X: gag intact, uvula midline ?CN XI: sternocleidomastoid and trapezius muscles intact ?CN XII: tongue midline ?Bulk & Tone: normal, no fasciculations. ?Motor:  muscle strength 5/5 throughout ?Sensation:  Pinprick, temperature and vibratory sensation intact. ?Deep Tendon Reflexes:  2+ throughout,  toes downgoing.   ?Finger to nose testing:  Without dysmetria.   ?Heel to shin:  Without dysmetria.   ?Gait:  Normal station and stride.  Romberg negative. ? ? ? ?Thank you for allowing me to take part in the care of this patient. ? ?Shon Millet, DO ? ?CC: Georgann Housekeeper, MD ? Eleanora Neighbor, MD ? ? ? ? ?

## 2021-09-05 ENCOUNTER — Ambulatory Visit (INDEPENDENT_AMBULATORY_CARE_PROVIDER_SITE_OTHER): Payer: 59 | Admitting: Neurology

## 2021-09-05 ENCOUNTER — Encounter: Payer: Self-pay | Admitting: Neurology

## 2021-09-05 ENCOUNTER — Other Ambulatory Visit (INDEPENDENT_AMBULATORY_CARE_PROVIDER_SITE_OTHER): Payer: 59

## 2021-09-05 VITALS — BP 174/97 | HR 81 | Ht 66.0 in | Wt 178.6 lb

## 2021-09-05 DIAGNOSIS — I10 Essential (primary) hypertension: Secondary | ICD-10-CM

## 2021-09-05 DIAGNOSIS — I6509 Occlusion and stenosis of unspecified vertebral artery: Secondary | ICD-10-CM | POA: Diagnosis not present

## 2021-09-05 DIAGNOSIS — E785 Hyperlipidemia, unspecified: Secondary | ICD-10-CM

## 2021-09-05 DIAGNOSIS — I6502 Occlusion and stenosis of left vertebral artery: Secondary | ICD-10-CM

## 2021-09-05 DIAGNOSIS — R519 Headache, unspecified: Secondary | ICD-10-CM | POA: Diagnosis not present

## 2021-09-05 DIAGNOSIS — E119 Type 2 diabetes mellitus without complications: Secondary | ICD-10-CM | POA: Diagnosis not present

## 2021-09-05 LAB — C-REACTIVE PROTEIN: CRP: 1.6 mg/dL (ref 0.5–20.0)

## 2021-09-05 LAB — SEDIMENTATION RATE: Sed Rate: 25 mm/hr — ABNORMAL HIGH (ref 0–20)

## 2021-09-05 NOTE — Patient Instructions (Addendum)
Start aspirin 81mg  daily ?Check MRI of brain ?Will check bilateral carotid ultrasound ?Will check sed rate and CRP ?Limit use of pain relievers to no more than 2 days out of week to prevent risk of rebound or medication-overuse headache. ?If headaches continue/worsen, contact me and we can start a daily preventative medication ?Follow up 4 months. ?

## 2021-09-07 NOTE — Progress Notes (Signed)
Patient advised of labs results. 

## 2021-09-13 ENCOUNTER — Other Ambulatory Visit (HOSPITAL_COMMUNITY): Payer: Self-pay

## 2021-09-13 ENCOUNTER — Ambulatory Visit (INDEPENDENT_AMBULATORY_CARE_PROVIDER_SITE_OTHER): Payer: 59 | Admitting: Internal Medicine

## 2021-09-13 ENCOUNTER — Telehealth: Payer: Self-pay | Admitting: Student-PharmD

## 2021-09-13 ENCOUNTER — Encounter: Payer: Self-pay | Admitting: Internal Medicine

## 2021-09-13 ENCOUNTER — Other Ambulatory Visit: Payer: Self-pay

## 2021-09-13 VITALS — BP 211/117 | HR 105 | Temp 98.3°F | Ht 66.0 in | Wt 178.0 lb

## 2021-09-13 DIAGNOSIS — I10 Essential (primary) hypertension: Secondary | ICD-10-CM | POA: Diagnosis not present

## 2021-09-13 DIAGNOSIS — B20 Human immunodeficiency virus [HIV] disease: Secondary | ICD-10-CM | POA: Diagnosis not present

## 2021-09-13 DIAGNOSIS — Z23 Encounter for immunization: Secondary | ICD-10-CM | POA: Diagnosis not present

## 2021-09-13 DIAGNOSIS — Z720 Tobacco use: Secondary | ICD-10-CM

## 2021-09-13 HISTORY — DX: Tobacco use: Z72.0

## 2021-09-13 NOTE — Assessment & Plan Note (Signed)
Counseled on cessation 

## 2021-09-13 NOTE — Assessment & Plan Note (Signed)
Blood pressure elevated and he will continue to work with his PCP on this.   ?

## 2021-09-13 NOTE — Progress Notes (Signed)
? ?  Subjective:  ? ? Patient ID: Kevin Burke, male    DOB: 1970/10/21, 51 y.o.   MRN: [id number] ? ?HPI ?Here for follow up of HIV ?He continues on [place] after being off late last year and being out of care.  I last saw him in February and his CD4 was up to 207 and viral load down to 54 copies.  His main issues now are related to high blood pressure and recent finding of an occlusion of the left vertebral artery in evaluation of a recent issue with headaches.   ? ? ? ?Review of Systems  ?Constitutional:  Negative for fatigue.  ?Gastrointestinal:  Negative for diarrhea and nausea.  ?Skin:  Negative for rash.  ? ?   ?Objective:  ? Physical Exam ?Eyes:  ?   General: No scleral icterus. ?Skin: ?   Findings: No rash.  ?Neurological:  ?   General: No focal deficit present.  ?   Mental Status: He is alert.  ?Psychiatric:     ?   Mood and Affect: Mood normal.  ? ?SH: + tobacco ? ? ? ?   ?Assessment & Plan:  ? ? ?

## 2021-09-13 NOTE — Telephone Encounter (Signed)
I will start working on the PA for [employer]

## 2021-09-13 NOTE — Telephone Encounter (Signed)
Patient interested in starting [place].  ? ?Counseled that Kevin Burke is two separate intramuscular injections in the gluteal muscle on each side for each visit. Explained that the second injection is 30 days after the initial injection then every 2 months thereafter. Discussed the need for viral load monitoring every 2 months for the first 6 months and then periodically afterwards as their provider sees the need. Explained that showing up to injection appointments is very important and warned that if 2 appointments are missed, it will be reassessed by their provider whether they are a good candidate for injection therapy. Counseled on possible side effects associated with the injections such as injection site pain, which is usually mild to moderate in nature, injection site nodules, and injection site reactions.  Advised that they can take ibuprofen or tylenol for injection site pain if needed.  ? ?Patient verbalized understanding and would like to move forward looking into insurance coverage for [place]. ? ?Gaye Alken, PharmD Candidate ?09/13/2021 3:33 PM ?  ?

## 2021-09-13 NOTE — Assessment & Plan Note (Signed)
He is doing well now and will confirm with labs today.  I discussed Kevin Burke as an option for him since he is interested and he was seen by pharmacy who will investigate coverage.  He will continue with Biktarvy until/if he converts to [place].   ?Otherwise he can return in about 4 months.  ?

## 2021-09-14 ENCOUNTER — Other Ambulatory Visit: Payer: Self-pay | Admitting: Pharmacist

## 2021-09-14 DIAGNOSIS — B20 Human immunodeficiency virus [HIV] disease: Secondary | ICD-10-CM

## 2021-09-14 MED ORDER — CABOTEGRAVIR & RILPIVIRINE ER 600 & 900 MG/3ML IM SUER: INTRAMUSCULAR | 0 refills | Status: DC

## 2021-09-16 LAB — HELPER T-LYMPH-CD4 (ARMC ONLY)
% CD 4 Pos. Lymph.: 7.7 % — ABNORMAL LOW (ref 30.8–58.5)
Absolute CD 4 Helper: 216 /uL — ABNORMAL LOW (ref 359–1519)
Basophils Absolute: 0 10*3/uL (ref 0.0–0.2)
Basos: 0 %
EOS (ABSOLUTE): 0.1 10*3/uL (ref 0.0–0.4)
Eos: 1 %
Hematocrit: 41.2 % (ref 34.0–46.6)
Hemoglobin: 14.2 g/dL (ref 11.1–15.9)
Immature Grans (Abs): 0.1 10*3/uL (ref 0.0–0.1)
Immature Granulocytes: 1 %
Lymphocytes Absolute: 2.8 10*3/uL (ref 0.7–3.1)
Lymphs: 49 %
MCH: 27 pg (ref 26.6–33.0)
MCHC: 34.5 g/dL (ref 31.5–35.7)
MCV: 79 fL (ref 79–97)
Monocytes Absolute: 0.2 10*3/uL (ref 0.1–0.9)
Monocytes: 4 %
Neutrophils Absolute: 2.6 10*3/uL (ref 1.4–7.0)
Neutrophils: 45 %
Platelets: 373 10*3/uL (ref 150–450)
RBC: 5.25 x10E6/uL (ref 3.77–5.28)
RDW: 15.6 % — ABNORMAL HIGH (ref 11.7–15.4)
WBC: 5.8 10*3/uL (ref 3.4–10.8)

## 2021-09-18 ENCOUNTER — Telehealth: Payer: Self-pay

## 2021-09-18 ENCOUNTER — Other Ambulatory Visit: Payer: Self-pay

## 2021-09-18 DIAGNOSIS — I6509 Occlusion and stenosis of unspecified vertebral artery: Secondary | ICD-10-CM

## 2021-09-18 LAB — HIV-1 RNA QUANT-NO REFLEX-BLD

## 2021-09-18 NOTE — Progress Notes (Signed)
Per Appt Notes: Patient has [facility] unable to take. ? ?Will send referral to [facility].  ? ?Referral sent to [facility] with note to start PA for the MRI W/O Contrast.  ? ? ? ?

## 2021-09-18 NOTE — Telephone Encounter (Signed)
[facility] Patient Advocate Encounter ? ?Prior Authorization for [place] has been approved.   ? ?PA# [id number] ?Effective dates: 09/14/21 through 07/17/22 ? ?J-Code [id number] Approved through Medical Benefits.  ? ?Patient is enrolled in [place] Portal Claims ? ? ? ? ? ?[facility] will continue to follow. ? ?[name], CPhT ?Specialty Pharmacy Patient Advocate ?[facility] ?Phone: [phone] ?Fax:  [phone]  ?

## 2021-09-19 ENCOUNTER — Telehealth: Payer: Self-pay | Admitting: Student-PharmD

## 2021-09-19 ENCOUNTER — Ambulatory Visit (INDEPENDENT_AMBULATORY_CARE_PROVIDER_SITE_OTHER): Payer: 59 | Admitting: Pharmacist

## 2021-09-19 ENCOUNTER — Other Ambulatory Visit: Payer: Self-pay

## 2021-09-19 DIAGNOSIS — B20 Human immunodeficiency virus [HIV] disease: Secondary | ICD-10-CM | POA: Diagnosis not present

## 2021-09-19 LAB — T-HELPER CELL (CD4) - (RCID CLINIC ONLY)

## 2021-09-19 MED ORDER — CABOTEGRAVIR & RILPIVIRINE ER 600 & 900 MG/3ML IM SUER
1.0000 | Freq: Once | INTRAMUSCULAR | Status: AC
  Administered 2021-09-19: 1 via INTRAMUSCULAR

## 2021-09-19 NOTE — Telephone Encounter (Signed)
Contacted patient to schedule Cabenuva initiation appointment. Patient scheduled for Cabenuva initiation today (4/25) at 2pm with Cassie.  ? ?Patient inquires about cost of Cabenuva. Informed patient that medication cost will be covered, but he will be charged for office visit.  ? ?Reminded patient that next injection will be scheduled in 30 days, and then every 2 months thereafter. Reminded patient that he will have a 2 week window for his injection appointments. Patient verbalized understanding. ? ?Gaye Alken, PharmD Candidate ?09/19/2021 10:08 AM ? ? ? ?

## 2021-09-19 NOTE — Progress Notes (Signed)
? ?HPI: Kevin Burke is a 51 y.o. male who presents to the [place] clinic for [place] administration. ? ?Patient Active Problem List  ? Diagnosis Date Noted  ? Tobacco abuse 09/13/2021  ? Routine screening for STI (sexually transmitted infection) 07/12/2021  ? Hypertriglyceridemia 08/18/2020  ? HTN (hypertension) 08/17/2020  ? Human immunodeficiency virus (HIV) disease ([place]) 07/29/2020  ? DM type 2, uncontrolled, with renal complications 22/29/7989  ? ? ?Patient's Medications  ?New Prescriptions  ? No medications on file  ?Previous Medications  ? AMLODIPINE (NORVASC) 5 MG TABLET    1 tablet  ? ATORVASTATIN (LIPITOR) 80 MG TABLET    TAKE 1 TABLET BY MOUTH EVERY DAY  ? BICTEGRAVIR-EMTRICITABINE-TENOFOVIR AF (BIKTARVY) 50-200-25 MG TABS TABLET    Take 1 tablet by mouth daily.  ? CABOTEGRAVIR & RILPIVIRINE ER (CABENUVA) 600 & 900 MG/3ML INJECTION    Inject 1 kit into the muscle every 30 (thirty) days for 60 days, THEN 1 kit every 2 (two) months.  ? CARVEDILOL (COREG) 25 MG TABLET    Take 1 tablet (25 mg total) by mouth 2 (two) times daily with a meal.  ? FENOFIBRATE 160 MG TABLET    Take 1 tablet (160 mg total) by mouth daily.  ? FUROSEMIDE (LASIX) 40 MG TABLET    Take 1 tablet by mouth daily as needed.  ? GLIPIZIDE (GLUCOTROL) 5 MG TABLET    Take 1 tablet (5 mg total) by mouth daily before breakfast.  ? LANCETS MISC. (PENLET II REPLACEMENT CAP) MISC    1 each by Does not apply route daily.  ? LISINOPRIL (ZESTRIL) 40 MG TABLET    Take 1 tablet (40 mg total) by mouth daily.  ? OMEGA 3 1000 MG CAPS    1 capsule  ? TESTOSTERONE 20.25 MG/ACT (1.62%) GEL    1 pump to skin in the morning to shoulder, upper arms or abdomen  ?Modified Medications  ? No medications on file  ?Discontinued Medications  ? No medications on file  ? ? ?Allergies: ?No Known Allergies ? ?Past Medical History: ?Past Medical History:  ?Diagnosis Date  ? HIV (human immunodeficiency virus infection) ([place])   ? Hypertension   ? Type 2 diabetes  mellitus ([place])   ? ? ?Social History: ?Social History  ? ?Socioeconomic History  ? Marital status: Single  ?  Spouse name: Not on file  ? Number of children: Not on file  ? Years of education: Not on file  ? Highest education level: Not on file  ?Occupational History  ? Not on file  ?Tobacco Use  ? Smoking status: Every Day  ?  Packs/day: 0.10  ?  Types: Cigarettes  ? Smokeless tobacco: Never  ? Tobacco comments:  ?  Less than 5 per day  ?Substance and Sexual Activity  ? Alcohol use: Yes  ?  Comment: seldom  ? Drug use: Not Currently  ? Sexual activity: Not Currently  ?  Partners: Male  ?Other Topics Concern  ? Not on file  ?Social History Narrative  ? Not on file  ? ?Social Determinants of Health  ? ?Financial Resource Strain: Not on file  ?Food Insecurity: Not on file  ?Transportation Needs: Not on file  ?Physical Activity: Not on file  ?Stress: Not on file  ?Social Connections: Not on file  ? ? ?Labs: ?Lab Results  ?Component Value Date  ? HIV1RNAQUANT CANCELED 09/13/2021  ? HIV1RNAQUANT 54 (H) 07/12/2021  ? HIV1RNAQUANT 1,780 (H) 06/05/2021  ? CD4TABS  175 (L) 06/05/2021  ? CD4TABS 208 (L) 07/27/2020  ? ? ?RPR and STI ?Lab Results  ?Component Value Date  ? LABRPR NON-REACTIVE 07/12/2021  ? LABRPR CANCELED 07/27/2020  ? ? ?STI Results GC CT  ?07/27/2020 ? 4:08 PM Negative   Negative    ? ? ?Hepatitis B ?Lab Results  ?Component Value Date  ? HEPBSAB NON-REACTIVE 07/27/2020  ? HEPBSAG NON-REACTIVE 07/27/2020  ? HEPBCAB NON-REACTIVE 07/27/2020  ? ?Hepatitis C ?Lab Results  ?Component Value Date  ? HEPCAB NON-REACTIVE 07/27/2020  ? ?Hepatitis A ?Lab Results  ?Component Value Date  ? HAV NON-REACTIVE 07/27/2020  ? ?Lipids: ?Lab Results  ?Component Value Date  ? CHOL 621 (H) 07/27/2020  ? TRIG 7,333 (H) 07/27/2020  ? HDL 24 (L) 07/27/2020  ? CHOLHDL 25.9 (H) 07/27/2020  ? [place]  07/27/2020  ?   Comment:  ?   . ?LDL cholesterol not calculated. Triglyceride levels ?greater than 400 mg/dL invalidate calculated LDL  results. ?. ?Reference range: <100 ?Marland Kitchen ?Desirable range <100 mg/dL for primary prevention;   ?<70 mg/dL for patients with CHD or diabetic patients  ?with > or = 2 CHD risk factors. ?. ?LDL-C is now calculated using the Martin-Hopkins  ?calculation, which is a validated novel method providing  ?better accuracy than the Friedewald equation in the  ?estimation of LDL-C.  ?Cresenciano Genre et al. Annamaria Helling. 3545;625(63): 2061-2068  ?(http://education.[facility].com/faq/FAQ164) ?  ? ? ?Current HIV Regimen: ?Biktarvy ? ?TARGET DATE: ?25th of the month ? ?Assessment: ?Kevin Burke presents today for their first initiation injection for Cabenuva. Counseled that [place] is two separate intramuscular injections in the gluteal muscle on each side for each visit. Explained that the second injection is 30 days after the initial injection then every 2 months thereafter. Discussed the need for viral load monitoring every 2 months for the first 6 months and then periodically afterwards as their provider sees the need. Discussed the rare but significant chance of developing resistance despite compliance. Explained that showing up to injection appointments is very important and warned that if 2 appointments are missed, it will be reassessed by their provider whether they are a good candidate for injection therapy. Counseled on possible side effects associated with the injections such as injection site pain, which is usually mild to moderate in nature, injection site nodules, and injection site reactions. Asked to call the clinic or send me a mychart message if they experience any issues, such as fatigue, nausea, headache, rash, or dizziness. Advised that they can take ibuprofen or tylenol for injection site pain if needed.  ? ?Administered cabotegravir 614m/3mL in left upper outer quadrant of the gluteal muscle. Administered rilpivirine 900 mg/330min the right upper outer quadrant of the gluteal muscle. Monitored patient for 10 minutes after  injection. Injections were tolerated well without issue. Counseled to stop taking Biktarvy after today's dose and to call with any issues that may arise. Will make follow up appointments for second initiation injection in 30 days and then maintenance injections every 2 months thereafter. HIV RNA from 4/19 was unable to result, so will check HIV RNA today.  ? ?Of note, Menveo 1/2 was administered on 09/13/21. Second dose due at least 2 months after 1st shot. Will administer Menveo 2/2 with follow up appointment.  ? ?Plan: ?- Stop Biktarvy after today's dose ?- Get HIV RNA today ?- First Cabenuva injections administered ?- Second initiation injection scheduled for 5/23 with Cassie  ?- Maintenance injections and Menveo 2/2 scheduled for 7/25 with Cassie ?-  Call with any issues or questions ? ?Marlowe Alt, PharmD Candidate ?09/19/2021 2:59 PM ?

## 2021-09-21 LAB — HIV-1 RNA QUANT-NO REFLEX-BLD
HIV 1 RNA Quant: 27 copies/mL — ABNORMAL HIGH
HIV-1 RNA Quant, Log: 1.43 Log copies/mL — ABNORMAL HIGH

## 2021-09-26 ENCOUNTER — Ambulatory Visit: Payer: 59 | Admitting: Cardiology

## 2021-09-29 ENCOUNTER — Ambulatory Visit: Payer: 59

## 2021-09-29 DIAGNOSIS — I6509 Occlusion and stenosis of unspecified vertebral artery: Secondary | ICD-10-CM

## 2021-10-16 ENCOUNTER — Other Ambulatory Visit: Payer: Self-pay

## 2021-10-16 ENCOUNTER — Ambulatory Visit (INDEPENDENT_AMBULATORY_CARE_PROVIDER_SITE_OTHER): Payer: 59 | Admitting: Pharmacist

## 2021-10-16 DIAGNOSIS — B20 Human immunodeficiency virus [HIV] disease: Secondary | ICD-10-CM | POA: Diagnosis not present

## 2021-10-16 MED ORDER — CABOTEGRAVIR & RILPIVIRINE ER 600 & 900 MG/3ML IM SUER
1.0000 | Freq: Once | INTRAMUSCULAR | Status: AC
  Administered 2021-10-16: 1 via INTRAMUSCULAR

## 2021-10-16 NOTE — Progress Notes (Signed)
HPI: STAFFORD RIVIERA is a 51 y.o. male who presents to the [facility] clinic for [place] administration.  Patient Active Problem List   Diagnosis Date Noted   Tobacco abuse 09/13/2021   Routine screening for STI (sexually transmitted infection) 07/12/2021   Hypertriglyceridemia 08/18/2020   HTN (hypertension) 08/17/2020   Human immunodeficiency virus (HIV) disease ([place]) 07/29/2020   DM type 2, uncontrolled, with renal complications 95/28/4132    Patient's Medications  New Prescriptions   No medications on file  Previous Medications   AMLODIPINE (NORVASC) 5 MG TABLET    1 tablet   ATORVASTATIN (LIPITOR) 80 MG TABLET    TAKE 1 TABLET BY MOUTH EVERY DAY   CABOTEGRAVIR & RILPIVIRINE ER (CABENUVA) 600 & 900 MG/3ML INJECTION    Inject 1 kit into the muscle every 30 (thirty) days for 60 days, THEN 1 kit every 2 (two) months.   CARVEDILOL (COREG) 25 MG TABLET    Take 1 tablet (25 mg total) by mouth 2 (two) times daily with a meal.   FENOFIBRATE 160 MG TABLET    Take 1 tablet (160 mg total) by mouth daily.   FUROSEMIDE (LASIX) 40 MG TABLET    Take 1 tablet by mouth daily as needed.   GLIPIZIDE (GLUCOTROL) 5 MG TABLET    Take 1 tablet (5 mg total) by mouth daily before breakfast.   LANCETS MISC. (PENLET II REPLACEMENT CAP) MISC    1 each by Does not apply route daily.   LISINOPRIL (ZESTRIL) 40 MG TABLET    Take 1 tablet (40 mg total) by mouth daily.   OMEGA 3 1000 MG CAPS    1 capsule   TESTOSTERONE 20.25 MG/ACT (1.62%) GEL    1 pump to skin in the morning to shoulder, upper arms or abdomen  Modified Medications   No medications on file  Discontinued Medications   No medications on file    Allergies: No Known Allergies  Past Medical History: Past Medical History:  Diagnosis Date   HIV (human immunodeficiency virus infection) ([place])    Hypertension    Type 2 diabetes mellitus ([place])     Social History: Social History   Socioeconomic History   Marital status: Single    Spouse  name: Not on file   Number of children: Not on file   Years of education: Not on file   Highest education level: Not on file  Occupational History   Not on file  Tobacco Use   Smoking status: Every Day    Packs/day: 0.10    Types: Cigarettes   Smokeless tobacco: Never   Tobacco comments:    Less than 5 per day  Substance and Sexual Activity   Alcohol use: Yes    Comment: seldom   Drug use: Not Currently   Sexual activity: Not Currently    Partners: Male  Other Topics Concern   Not on file  Social History Narrative   Not on file   Social Determinants of Health   Financial Resource Strain: Not on file  Food Insecurity: Not on file  Transportation Needs: Not on file  Physical Activity: Not on file  Stress: Not on file  Social Connections: Not on file    Labs: Lab Results  Component Value Date   HIV1RNAQUANT 27 (H) 09/19/2021   HIV1RNAQUANT CANCELED 09/13/2021   HIV1RNAQUANT 54 (H) 07/12/2021   CD4TABS 175 (L) 06/05/2021   CD4TABS 208 (L) 07/27/2020    RPR and STI Lab Results  Component Value Date   LABRPR NON-REACTIVE 07/12/2021   LABRPR CANCELED 07/27/2020    STI Results GC CT  07/27/2020  4:08 PM Negative   Negative      Hepatitis B Lab Results  Component Value Date   HEPBSAB NON-REACTIVE 07/27/2020   HEPBSAG NON-REACTIVE 07/27/2020   HEPBCAB NON-REACTIVE 07/27/2020   Hepatitis C Lab Results  Component Value Date   HEPCAB NON-REACTIVE 07/27/2020   Hepatitis A Lab Results  Component Value Date   HAV NON-REACTIVE 07/27/2020   Lipids: Lab Results  Component Value Date   CHOL 621 (H) 07/27/2020   TRIG 7,333 (H) 07/27/2020   HDL 24 (L) 07/27/2020   CHOLHDL 25.9 (H) 07/27/2020   [place]  07/27/2020     Comment:     . LDL cholesterol not calculated. Triglyceride levels greater than 400 mg/dL invalidate calculated LDL results. . Reference range: <100 . Desirable range <100 mg/dL for primary prevention;   <70 mg/dL for patients with CHD or  diabetic patients  with > or = 2 CHD risk factors. Marland Kitchen LDL-C is now calculated using the Martin-Hopkins  calculation, which is a validated novel method providing  better accuracy than the Friedewald equation in the  estimation of LDL-C.  Cresenciano Genre et al. Annamaria Helling. 3704;888(91): 2061-2068  (http://education.[facility].com/faq/FAQ164)     TARGET DATE: 25th of the month  Assessment: [name] presents today for their maintenance Cabenuva injections. Past injections were tolerated well without issues. No problems with systemic side effects of injections. Patient did experience some soreness for a few days post injection that got better with tylenol. HIV RNA and CD4 count were ordered today for monitoring. STI testing was politely declined as patient has not been sexually active. 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  Administered cabotegravir 67m/3mL in left upper outer quadrant of the gluteal muscle. Administered rilpivirine 900 mg/382min the right upper outer quadrant of the gluteal muscle. Injections were tolerated well. Patient will follow up in 2 months for next set of injections.  Plan: - Cabenuva injections administered - Next injections scheduled for 7/25 with Dr. KuEber Hong Call with any issues or questions - Follow up on HIV RNA and CD4 count to monitor therapy - Follow up with Menveo second vaccination on July visit  MaVarney DailyPharmD PGY1 Pharmacy Resident [facility]

## 2021-10-17 ENCOUNTER — Ambulatory Visit: Payer: 59 | Admitting: Pharmacist

## 2021-10-17 LAB — T-HELPER CELLS (CD4) COUNT (NOT AT ARMC)
CD4 % Helper T Cell: 7 % — ABNORMAL LOW (ref 33–65)
CD4 T Cell Abs: 210 /uL — ABNORMAL LOW (ref 400–1790)

## 2021-10-18 LAB — HIV-1 RNA QUANT-NO REFLEX-BLD
HIV 1 RNA Quant: <20 copies/mL — AB
HIV-1 RNA Quant, Log: <1.3 Log copies/mL — AB

## 2021-12-12 ENCOUNTER — Ambulatory Visit (INDEPENDENT_AMBULATORY_CARE_PROVIDER_SITE_OTHER): Payer: 59 | Admitting: Pharmacist

## 2021-12-12 ENCOUNTER — Other Ambulatory Visit: Payer: Self-pay

## 2021-12-12 DIAGNOSIS — B20 Human immunodeficiency virus [HIV] disease: Secondary | ICD-10-CM | POA: Diagnosis not present

## 2021-12-12 DIAGNOSIS — Z23 Encounter for immunization: Secondary | ICD-10-CM

## 2021-12-12 MED ORDER — CABOTEGRAVIR & RILPIVIRINE ER 600 & 900 MG/3ML IM SUER
1.0000 | Freq: Once | INTRAMUSCULAR | Status: AC
  Administered 2021-12-12: 1 via INTRAMUSCULAR

## 2021-12-12 NOTE — Progress Notes (Signed)
HPI: Kevin Burke is a 51 y.o. male who presents to the [facility] for [place] administration.  Patient Active Problem List   Diagnosis Date Noted   Tobacco abuse 09/13/2021   Routine screening for STI (sexually transmitted infection) 07/12/2021   Hypertriglyceridemia 08/18/2020   HTN (hypertension) 08/17/2020   Human immunodeficiency virus (HIV) disease ([place]) 07/29/2020   DM type 2, uncontrolled, with renal complications 56/43/3295    Patient's Medications  New Prescriptions   No medications on file  Previous Medications   AMLODIPINE (NORVASC) 5 MG TABLET    1 tablet   ATORVASTATIN (LIPITOR) 80 MG TABLET    TAKE 1 TABLET BY MOUTH EVERY DAY   CABOTEGRAVIR & RILPIVIRINE ER (CABENUVA) 600 & 900 MG/3ML INJECTION    Inject 1 kit into the muscle every 30 (thirty) days for 60 days, THEN 1 kit every 2 (two) months.   CARVEDILOL (COREG) 25 MG TABLET    Take 1 tablet (25 mg total) by mouth 2 (two) times daily with a meal.   FENOFIBRATE 160 MG TABLET    Take 1 tablet (160 mg total) by mouth daily.   FUROSEMIDE (LASIX) 40 MG TABLET    Take 1 tablet by mouth daily as needed.   GLIPIZIDE (GLUCOTROL) 5 MG TABLET    Take 1 tablet (5 mg total) by mouth daily before breakfast.   LANCETS MISC. (PENLET II REPLACEMENT CAP) MISC    1 each by Does not apply route daily.   LISINOPRIL (ZESTRIL) 40 MG TABLET    Take 1 tablet (40 mg total) by mouth daily.   OMEGA 3 1000 MG CAPS    1 capsule   TESTOSTERONE 20.25 MG/ACT (1.62%) GEL    1 pump to skin in the morning to shoulder, upper arms or abdomen  Modified Medications   No medications on file  Discontinued Medications   No medications on file    Allergies: No Known Allergies  Past Medical History: Past Medical History:  Diagnosis Date   HIV (human immunodeficiency virus infection) ([place])    Hypertension    Type 2 diabetes mellitus ([place])     Social History: Social History   Socioeconomic History   Marital status: Single    Spouse  name: Not on file   Number of children: Not on file   Years of education: Not on file   Highest education level: Not on file  Occupational History   Not on file  Tobacco Use   Smoking status: Every Day    Packs/day: 0.10    Types: Cigarettes   Smokeless tobacco: Never   Tobacco comments:    Less than 5 per day  Substance and Sexual Activity   Alcohol use: Yes    Comment: seldom   Drug use: Not Currently   Sexual activity: Not Currently    Partners: Male  Other Topics Concern   Not on file  Social History Narrative   Not on file   Social Determinants of Health   Financial Resource Strain: Not on file  Food Insecurity: Not on file  Transportation Needs: Not on file  Physical Activity: Not on file  Stress: Not on file  Social Connections: Not on file    Labs: Lab Results  Component Value Date   HIV1RNAQUANT <20 DETECTED (A) 10/16/2021   HIV1RNAQUANT 27 (H) 09/19/2021   HIV1RNAQUANT CANCELED 09/13/2021   CD4TABS 210 (L) 10/16/2021   CD4TABS 175 (L) 06/05/2021   CD4TABS 208 (L) 07/27/2020  RPR and STI Lab Results  Component Value Date   LABRPR NON-REACTIVE 07/12/2021   LABRPR CANCELED 07/27/2020    STI Results GC CT  07/27/2020  4:08 PM Negative  Negative     Hepatitis B Lab Results  Component Value Date   HEPBSAB NON-REACTIVE 07/27/2020   HEPBSAG NON-REACTIVE 07/27/2020   HEPBCAB NON-REACTIVE 07/27/2020   Hepatitis C Lab Results  Component Value Date   HEPCAB NON-REACTIVE 07/27/2020   Hepatitis A Lab Results  Component Value Date   HAV NON-REACTIVE 07/27/2020   Lipids: Lab Results  Component Value Date   CHOL 621 (H) 07/27/2020   TRIG 7,333 (H) 07/27/2020   HDL 24 (L) 07/27/2020   CHOLHDL 25.9 (H) 07/27/2020   [place]  07/27/2020     Comment:     . LDL cholesterol not calculated. Triglyceride levels greater than 400 mg/dL invalidate calculated LDL results. . Reference range: <100 . Desirable range <100 mg/dL for primary prevention;    <70 mg/dL for patients with CHD or diabetic patients  with > or = 2 CHD risk factors. Marland Kitchen LDL-C is now calculated using the Martin-Hopkins  calculation, which is a validated novel method providing  better accuracy than the Friedewald equation in the  estimation of LDL-C.  Cresenciano Genre et al. Annamaria Helling. 1624;469(50): 2061-2068  (http://education.[employer].com/faq/FAQ164)     TARGET DATE: The 25th  Assessment: Kevin Burke presents today for his maintenance Cabenuva injections. Past injections were tolerated well without issues.  Administered cabotegravir 652m/3mL in left upper outer quadrant of the gluteal muscle. Administered rilpivirine 900 mg/359min the right upper outer quadrant of the gluteal muscle. No issues with injections. He will follow up in 2 months for next set of injections.  He received his 2nd Menveo vaccine today as well.  Plan: - Cabenuva injections administered - HIV RNA today - Menveo vaccine today; next due in 5 years - Next injections scheduled for 02/12/22 - Call with any issues or questions  Brylee Berk L. Viney Acocella, PharmD, BCIDP, AAHIVP, CPAguilalinical Pharmacist Practitioner [facility]

## 2021-12-15 LAB — HIV-1 RNA QUANT-NO REFLEX-BLD
HIV 1 RNA Quant: 29 Copies/mL — ABNORMAL HIGH
HIV-1 RNA Quant, Log: 1.47 Log cps/mL — ABNORMAL HIGH

## 2021-12-19 ENCOUNTER — Ambulatory Visit: Payer: 59 | Admitting: Pharmacist

## 2021-12-27 ENCOUNTER — Ambulatory Visit: Payer: 59 | Admitting: Internal Medicine

## 2022-02-06 ENCOUNTER — Other Ambulatory Visit (HOSPITAL_COMMUNITY): Payer: Self-pay

## 2022-02-06 ENCOUNTER — Telehealth: Payer: Self-pay

## 2022-02-06 NOTE — Telephone Encounter (Signed)
[facility] Patient Advocate Encounter  Completed and sent [employer] Patient Assistance application for Cabenuva for this patient who is uninsured.    Patient assistance phone number for follow up is [phone].   This encounter will be updated until final determination.   Ayra Hodgdon, CPhT Specialty Pharmacy Patient Advocate [facility] Phone: [phone] Fax:  [phone]  

## 2022-02-07 ENCOUNTER — Telehealth: Payer: Self-pay

## 2022-02-07 NOTE — Telephone Encounter (Signed)
[facility] Patient Advocate Encounter  Completed and sent [employer] Patient Assistance application for Cabenuva for this patient who is uninsured.    Patient is approved 02/06/22 through 02/05/23.  Medication will be delivered to the clinic    Clearance Coots, CPhT Specialty Pharmacy Patient [facility] Phone: [phone] Fax:  [phone]

## 2022-02-08 ENCOUNTER — Telehealth: Payer: Self-pay

## 2022-02-08 NOTE — Telephone Encounter (Signed)
[facility] Patient Advocate Encounter  Patient's medications have been couriered to [facility] from [employer]: [phone], and will be administered on next appointment date.Marland Kitchen

## 2022-02-09 ENCOUNTER — Encounter: Payer: Self-pay | Admitting: Pharmacist

## 2022-02-12 ENCOUNTER — Other Ambulatory Visit: Payer: Self-pay

## 2022-02-12 ENCOUNTER — Encounter: Payer: Self-pay | Admitting: Pharmacist

## 2022-02-12 ENCOUNTER — Encounter: Payer: Self-pay | Admitting: Family

## 2022-02-12 ENCOUNTER — Telehealth (INDEPENDENT_AMBULATORY_CARE_PROVIDER_SITE_OTHER): Payer: Self-pay | Admitting: Family

## 2022-02-12 DIAGNOSIS — U071 COVID-19: Secondary | ICD-10-CM

## 2022-02-12 HISTORY — DX: COVID-19: U07.1

## 2022-02-12 NOTE — Progress Notes (Signed)
Subjective:    Patient ID: Kevin Burke, male    DOB: Mar 10, 1971, 51 y.o.   MRN: [id number]  Chief Complaint  Patient presents with   Covid Positive    HPI:  Kevin Burke is a 51 y.o. male with HIV disease last seen on 12/12/21 by Magda Kiel, PharmD, CPP for q 2 month injection of Cabenuva presents for an acute telehealth visit.   Kevin Burke tested positive for Covid-19 on Saturday 02/10/22 through [place]. Symptoms started on Wednesday with worsening symptoms of sore throat, feeling feverish, decreased smell/taste, cough and occasional mucus production. Has also had looser stools. Last fever was yesterday and has been taking Tylenol and OTC medications as needed for symptom relief. 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   No Known Allergies    Outpatient Medications Prior to Visit  Medication Sig Dispense Refill   amLODipine (NORVASC) 5 MG tablet 1 tablet     atorvastatin (LIPITOR) 80 MG tablet TAKE 1 TABLET BY MOUTH EVERY DAY (Patient not taking: Reported on 09/13/2021) 30 tablet 1   cabotegravir & rilpivirine ER (CABENUVA) 600 & 900 MG/3ML injection Inject 1 kit into the muscle every 30 (thirty) days for 60 days, THEN 1 kit every 2 (two) months. 48 mL 0   carvedilol (COREG) 25 MG tablet Take 1 tablet (25 mg total) by mouth 2 (two) times daily with a meal. 60 tablet 1   fenofibrate 160 MG tablet Take 1 tablet (160 mg total) by mouth daily. 30 tablet 1   furosemide (LASIX) 40 MG tablet Take 1 tablet by mouth daily as needed.     glipiZIDE (GLUCOTROL) 5 MG tablet Take 1 tablet (5 mg total) by mouth daily before breakfast. 30 tablet 1   Lancets Misc. (PENLET II REPLACEMENT CAP) MISC 1 each by Does not apply route daily. 30 each 0   lisinopril (ZESTRIL) 40 MG tablet Take 1 tablet (40 mg total) by mouth daily. 30 tablet 1   Omega 3 1000 MG CAPS 1 capsule     Testosterone 20.25 MG/ACT (1.62%) GEL 1 pump to skin in the morning to shoulder, upper arms  or abdomen     No facility-administered medications prior to visit.     Past Medical History:  Diagnosis Date   HIV (human immunodeficiency virus infection) ([place])    Hypertension    Type 2 diabetes mellitus ([place])      Past Surgical History:  Procedure Laterality Date   CHOLECYSTECTOMY     HERNIA REPAIR  2018       Review of Systems  Constitutional:  Positive for fatigue and fever. Negative for unexpected weight change.  HENT:  Positive for congestion and sore throat. Negative for sinus pain.        Decreased taste/smell  Respiratory:  Positive for cough.       Objective:    There were no vitals taken for this visit. Nursing note and vital signs reviewed.  Physical Exam  Limited secondary to telehealth visit. Kevin Burke is pleasant to speak with and sounds in no apparent distress.       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 Outside the treatment window for Paxlovid although appears to be improving slowly over the last 24 hours. Discussed the likely course of the infection and anticipation of continued improvement. Will need to quarentine for 5 days and then wear a mask for additional 5 days. Advised that he does need to remain fever free for at least 24 hours without use of  medication. In the interim continue OTC medications as needed for symptom relief and supportive care. Note for work provided. Follow up if symptoms worsen or do not improve.         I am having Kevin Burke "Kevin Burke" maintain his fenofibrate, lisinopril, carvedilol, glipiZIDE, Penlet II Replacement Cap, atorvastatin, amLODipine, furosemide, Omega 3, Testosterone, and cabotegravir & rilpivirine ER.   Virtual Visit via Telephone/Video Note   I connected with Kevin Burke on 02/12/2022  at 11:30 am by telephone and verified that I am speaking with the correct person using two identifiers.   I discussed the limitations, risks, security and privacy concerns of performing an evaluation and management service by telephone and the availability of in person appointments. I also discussed with the patient that there may be a patient responsible charge related to this service. The patient expressed understanding and agreed to proceed.  Location:  Patient: Home Provider: [facility]  I discussed the assessment and treatment plan with the patient. The patient was provided an opportunity to ask questions and all were answered. The patient agreed with the plan and demonstrated an understanding of the instructions.   The patient was advised to call back or seek an in-person evaluation if the symptoms worsen or if the condition fails to improve as anticipated.   I provided 8  minutes of non-face-to-face time during this encounter.   Follow-up: As needed for worsening.    Terri Piedra, MSN, FNP-C Nurse Practitioner [facility] for Infectious Disease [place] number: [phone]

## 2022-02-12 NOTE — Assessment & Plan Note (Signed)
Mr. Gravely is newly diagnosed with Covid-19 infection in the setting of being vaccinated. Outside the treatment window for Paxlovid although appears to be improving slowly over the last 24 hours. Discussed the likely course of the infection and anticipation of continued improvement. Will need to quarentine for 5 days and then wear a mask for additional 5 days. Advised that he does need to remain fever free for at least 24 hours without use of medication. In the interim continue OTC medications as needed for symptom relief and supportive care. Note for work provided. Follow up if symptoms worsen or do not improve.

## 2022-02-16 ENCOUNTER — Encounter: Payer: Self-pay | Admitting: Infectious Diseases

## 2022-02-16 ENCOUNTER — Ambulatory Visit (INDEPENDENT_AMBULATORY_CARE_PROVIDER_SITE_OTHER): Payer: Self-pay | Admitting: Infectious Diseases

## 2022-02-16 ENCOUNTER — Other Ambulatory Visit: Payer: Self-pay

## 2022-02-16 ENCOUNTER — Ambulatory Visit: Payer: Self-pay

## 2022-02-16 VITALS — BP 176/121 | HR 110 | Temp 98.1°F | Wt 173.2 lb

## 2022-02-16 DIAGNOSIS — U071 COVID-19: Secondary | ICD-10-CM

## 2022-02-16 DIAGNOSIS — B20 Human immunodeficiency virus [HIV] disease: Secondary | ICD-10-CM

## 2022-02-16 DIAGNOSIS — Z23 Encounter for immunization: Secondary | ICD-10-CM

## 2022-02-16 MED ORDER — CABOTEGRAVIR & RILPIVIRINE ER 600 & 900 MG/3ML IM SUER
1.0000 | Freq: Once | INTRAMUSCULAR | Status: AC
  Administered 2022-02-16: 1 via INTRAMUSCULAR

## 2022-02-16 NOTE — Assessment & Plan Note (Signed)
90% better after symptomatic care. Recommended new COVID19 booster in 3 - 6 months as he has boost of natural immunity following this recovery. He was OK getting his flu shot today as well.

## 2022-02-16 NOTE — Progress Notes (Signed)
Subjective:    Patient ID: Kevin Burke, male    DOB: 1970/12/29, 51 y.o.   MRN: [id number]  CC:  Cabenuva injection - maintenance injection    HPI:  Kevin Burke is a 51 y.o. male with well controlled HIV disease here for q59mmaintenance Cabenuva injection visit. He was transtion to long acting injectables a few months ago now and seems to have had good effect so far with VL 29 copies at last assessment in July. He has mild injection site reactions that last a few days but worth to continue this for treatment in lieu of daily pills.   Recovering from CBeaver19 acute infection - symptoms initially started Wednesday 9/13, tested positive and has recovered without any treatment outside symptomatic care. Feels about 90% back to normal now.    No Known Allergies    Outpatient Medications Prior to Visit  Medication Sig Dispense Refill   amLODipine (NORVASC) 5 MG tablet 1 tablet     atorvastatin (LIPITOR) 80 MG tablet TAKE 1 TABLET BY MOUTH EVERY DAY (Patient not taking: Reported on 09/13/2021) 30 tablet 1   cabotegravir & rilpivirine ER (CABENUVA) 600 & 900 MG/3ML injection Inject 1 kit into the muscle every 30 (thirty) days for 60 days, THEN 1 kit every 2 (two) months. 48 mL 0   carvedilol (COREG) 25 MG tablet Take 1 tablet (25 mg total) by mouth 2 (two) times daily with a meal. 60 tablet 1   fenofibrate 160 MG tablet Take 1 tablet (160 mg total) by mouth daily. 30 tablet 1   furosemide (LASIX) 40 MG tablet Take 1 tablet by mouth daily as needed.     glipiZIDE (GLUCOTROL) 5 MG tablet Take 1 tablet (5 mg total) by mouth daily before breakfast. 30 tablet 1   Lancets Misc. (PENLET II REPLACEMENT CAP) MISC 1 each by Does not apply route daily. 30 each 0   lisinopril (ZESTRIL) 40 MG tablet Take 1 tablet (40 mg total) by mouth daily. 30 tablet 1   Omega 3 1000 MG CAPS 1 capsule     Testosterone 20.25 MG/ACT (1.62%) GEL 1 pump to skin in the morning to shoulder, upper arms or abdomen     No  facility-administered medications prior to visit.     Past Medical History:  Diagnosis Date   HIV [place] virus infection) [place]    Hypertension    Type 2 diabetes mellitus ([place]       Past Surgical History:  Procedure Laterality Date   CHOLECYSTECTOMY     HERNIA REPAIR  2018      Review of Systems      Objective:    BP (!) 176/121   Pulse (!) 110   Temp 98.1 F (36.7 C) (Oral)   Wt 173 lb 3.2 oz (78.6 kg)   SpO2 97%   BMI 27.96 kg/m  Nursing note and vital signs reviewed.  Physical Exam      Assessment & Plan:   Patient Active Problem List   Diagnosis Date Noted   COVID-19 virus infection 02/12/2022   Tobacco abuse 09/13/2021   Routine screening for STI (sexually transmitted infection) 07/12/2021   Hypertriglyceridemia 08/18/2020   HTN (hypertension) 08/17/2020   Human immunodeficiency virus (HIV) disease ([place] 07/29/2020   DM type 2, uncontrolled, with renal complications 062/69/4854   Problem List Items Addressed This Visit       Unprioritized   Human immunodeficiency virus (HIV) disease ([place]  Doing well on q54minjections of Cabenuva. Tolerating well with only minor injection site reactions. All injections have been administered at appropriate treatment interval.  The patient will return as expected for maintenance injections.  Will continue with Q246miral load monitoring during first 6 months for therapeutic treatment monitoring.   Last VL result:  HIV 1 RNA Quant (Copies/mL)  Date Value  12/12/2021 29 (H)   Dose Interval: Q2m72m. 25th Next Appointment: 02/16/2022        Relevant Orders   HIV 1 RNA quant-no reflex-bld   COVID-19 virus infection    90% better after symptomatic care. Recommended new COVID19 booster in 3 - 6 months as he has boost of natural immunity following this recovery. He was OK getting his flu shot today as well.       Other Visit Diagnoses     Need for immunization against  influenza       Relevant Orders   Flu Vaccine QUAD 36mo48mo(Fluarix, Fluzone & Alfiuria Quad PF) (Completed)         I am having Kevin Burke "Dan" maintain his fenofibrate, lisinopril, carvedilol, glipiZIDE, Penlet II Replacement Cap, atorvastatin, amLODipine, furosemide, Omega 3, Testosterone, and cabotegravir & rilpivirine ER. We administered cabotegravir & rilpivirine ER.   Meds ordered this encounter  Medications   cabotegravir & rilpivirine ER (CABENUVA) 600 & 900 MG/3ML injection 1 kit    StepJanene MadeiraN, NP-C [facility] [email or web address] Pager: [phone]: [phone]

## 2022-02-16 NOTE — Assessment & Plan Note (Signed)
Doing well on q32m injections of Cabenuva. Tolerating well with only minor injection site reactions. All injections have been administered at appropriate treatment interval.  The patient will return as expected for maintenance injections.  Will continue with Q20m viral load monitoring during first 6 months for therapeutic treatment monitoring.   Last VL result:  HIV 1 RNA Quant (Copies/mL)  Date Value  12/12/2021 29 (H)    Dose Interval: Q32m TD. 25th Next Appointment: 02/16/2022

## 2022-02-19 LAB — HIV-1 RNA QUANT-NO REFLEX-BLD
HIV 1 RNA Quant: NOT DETECTED Copies/mL
HIV-1 RNA Quant, Log: NOT DETECTED Log cps/mL

## 2022-03-05 NOTE — Progress Notes (Deleted)
NEUROLOGY FOLLOW UP OFFICE NOTE  LAKIN ROMER [id number]  Assessment/Plan:   Left sided headache- unclear etiology.  May be related to his elevated blood pressure.  Unrelated to the vertebral artery occlusion.   Left intracranial vertebral artery occlusion Hypertension Hyperlipidemia Type 2 diabetes mellitus   1  Recommend starting ASA 77m daily and continue management of stroke risk factors (DM II, HLD, HTN) - follow up with PCP regarding BP 2  Check MRI of brain without contrast 3  Low suspicion but will check sed rate and CRP to evaluate for GCA 4  Given evidence of intracranial vascular occlusion, will check extracranial vessels (ICAs and VAs) with carotid ultrasound 5  Limit use of pain relievers to no more than 2 days out of week to prevent risk of rebound or medication-overuse headache.  If headaches persist or worsen, he should contact [place] we can start a headache preventative  6  Follow up 4 months.     Subjective:  DDAMIEON ARMENDARIZis a 51year old male with CHF, HIV, HTN, DM II and hypogonadism who follows up for headache and intracranial stenosis.    UPDATE: Medications include:  ASA 864m Biktarvy, lisinopril, amlodipine, carvidelol, furosemide, testosterone, glipizide  Labs on 09/05/2021 revealed sed rate 28 and CRP 1.6.  Bilateral carotid ultrasound on 09/29/2021 showed mild homogeneous plaque in the bilateral ICAs but no hemodynamically significant stenosis and antegrade flow in both VAs.  MRI ***   HISTORY: On 08/26/2021 he woke up with left sided throbbing headache.  No associated visual disturbance, nausea, photophobia, phonophobia, dizziness or unilateral numbness or weakness.  Does not normally get headaches.  As symptoms were persistent, he went to the ED the following day.  CT/CTA of head on 08/28/2021 personally reviewed showed left vertebral artery occlusion with retrograde filling of the distal left V4 segment across the vertebrobasilar junction.  No aneurysms.   Still has the headache.  Not as severe, now mild, intermittent but still day.  Takes Excedrin daily.  No neck pain     PAST MEDICAL HISTORY: Past Medical History:  Diagnosis Date   HIV (human immunodeficiency virus infection) ([place]   Hypertension    Type 2 diabetes mellitus ([place]    MEDICATIONS: Current Outpatient Medications on File Prior to Visit  Medication Sig Dispense Refill   amLODipine (NORVASC) 5 MG tablet 1 tablet     atorvastatin (LIPITOR) 80 MG tablet TAKE 1 TABLET BY MOUTH EVERY DAY (Patient not taking: Reported on 09/13/2021) 30 tablet 1   cabotegravir & rilpivirine ER (CABENUVA) 600 & 900 MG/3ML injection Inject 1 kit into the muscle every 30 (thirty) days for 60 days, THEN 1 kit every 2 (two) months. 48 mL 0   carvedilol (COREG) 25 MG tablet Take 1 tablet (25 mg total) by mouth 2 (two) times daily with a meal. 60 tablet 1   fenofibrate 160 MG tablet Take 1 tablet (160 mg total) by mouth daily. 30 tablet 1   furosemide (LASIX) 40 MG tablet Take 1 tablet by mouth daily as needed.     glipiZIDE (GLUCOTROL) 5 MG tablet Take 1 tablet (5 mg total) by mouth daily before breakfast. 30 tablet 1   Lancets Misc. (PENLET II REPLACEMENT CAP) MISC 1 each by Does not apply route daily. 30 each 0   lisinopril (ZESTRIL) 40 MG tablet Take 1 tablet (40 mg total) by mouth daily. 30 tablet 1   Omega 3 1000 MG CAPS 1 capsule  Testosterone 20.25 MG/ACT (1.62%) GEL 1 pump to skin in the morning to shoulder, upper arms or abdomen     No current facility-administered medications on file prior to visit.    ALLERGIES: No Known Allergies  FAMILY HISTORY: Family History  Adopted: Yes      Objective:  *** General: No acute distress.  Patient appears ***-groomed.   Head:  Normocephalic/atraumatic Eyes:  Fundi examined but not visualized Neck: supple, no paraspinal tenderness, full range of motion Heart:  Regular rate and rhythm Lungs:  Clear to auscultation bilaterally Back: No  paraspinal tenderness Neurological Exam: alert and oriented to person, place, and time.  Speech fluent and not dysarthric, language intact.  CN II-XII intact. Bulk and tone normal, muscle strength 5/5 throughout.  Sensation to light touch intact.  Deep tendon reflexes 2+ throughout, toes downgoing.  Finger to nose testing intact.  Gait normal, Romberg negative.   Metta Clines, DO  CC: ***

## 2022-03-07 ENCOUNTER — Encounter: Payer: Self-pay | Admitting: Neurology

## 2022-03-07 ENCOUNTER — Ambulatory Visit: Payer: Self-pay | Admitting: Neurology

## 2022-03-07 DIAGNOSIS — Z029 Encounter for administrative examinations, unspecified: Secondary | ICD-10-CM

## 2022-03-15 ENCOUNTER — Telehealth: Payer: Self-pay

## 2022-03-15 NOTE — Telephone Encounter (Signed)
[facility] Patient Advocate Encounter  Patient's medication [name] have been couriered to [facility] from [employer] and will be administered on the patient next office visit on 04/16/22.  [name] , [place] [facility] Patient [facility] Phone: [phone] Fax:  [phone]

## 2022-04-14 NOTE — Progress Notes (Incomplete)
HPI: Kevin Burke is a 51 y.o. male who presents to the [facility] for [facility] [place] administration.  Patient Active Problem List   Diagnosis Date Noted   COVID-19 virus infection 02/12/2022   Tobacco abuse 09/13/2021   Routine screening for STI (sexually transmitted infection) 07/12/2021   Hypertriglyceridemia 08/18/2020   HTN (hypertension) 08/17/2020   Human immunodeficiency virus (HIV) disease ([place]) 07/29/2020   DM type 2, uncontrolled, with renal complications 81/82/9937    Patient's Medications  New Prescriptions   No medications on file  Previous Medications   AMLODIPINE (NORVASC) 5 MG TABLET    1 tablet   ATORVASTATIN (LIPITOR) 80 MG TABLET    TAKE 1 TABLET BY MOUTH EVERY DAY   CABOTEGRAVIR & RILPIVIRINE ER (CABENUVA) 600 & 900 MG/3ML INJECTION    Inject 1 kit into the muscle every 30 (thirty) days for 60 days, THEN 1 kit every 2 (two) months.   CARVEDILOL (COREG) 25 MG TABLET    Take 1 tablet (25 mg total) by mouth 2 (two) times daily with a meal.   FENOFIBRATE 160 MG TABLET    Take 1 tablet (160 mg total) by mouth daily.   FUROSEMIDE (LASIX) 40 MG TABLET    Take 1 tablet by mouth daily as needed.   GLIPIZIDE (GLUCOTROL) 5 MG TABLET    Take 1 tablet (5 mg total) by mouth daily before breakfast.   LANCETS MISC. (PENLET II REPLACEMENT CAP) MISC    1 each by Does not apply route daily.   LISINOPRIL (ZESTRIL) 40 MG TABLET    Take 1 tablet (40 mg total) by mouth daily.   OMEGA 3 1000 MG CAPS    1 capsule   TESTOSTERONE 20.25 MG/ACT (1.62%) GEL    1 pump to skin in the morning to shoulder, upper arms or abdomen  Modified Medications   No medications on file  Discontinued Medications   No medications on file    Allergies: No Known Allergies  Past Medical History: Past Medical History:  Diagnosis Date   HIV (human immunodeficiency virus infection) ([place])    Hypertension    Type 2 diabetes mellitus ([place])     Social History: Social History   Socioeconomic  History   Marital status: Single    Spouse name: Not on file   Number of children: Not on file   Years of education: Not on file   Highest education level: Not on file  Occupational History   Not on file  Tobacco Use   Smoking status: Every Day    Packs/day: 0.10    Types: Cigarettes   Smokeless tobacco: Never   Tobacco comments:    Less than 5 per day  Substance and Sexual Activity   Alcohol use: Yes    Comment: seldom   Drug use: Not Currently   Sexual activity: Not Currently    Partners: Male  Other Topics Concern   Not on file  Social History Narrative   Not on file   Social Determinants of Health   Financial Resource Strain: Not on file  Food Insecurity: Not on file  Transportation Needs: Not on file  Physical Activity: Not on file  Stress: Not on file  Social Connections: Not on file    Labs: Lab Results  Component Value Date   HIV1RNAQUANT Not Detected 02/16/2022   HIV1RNAQUANT 29 (H) 12/12/2021   HIV1RNAQUANT <20 DETECTED (A) 10/16/2021   CD4TABS 210 (L) 10/16/2021   CD4TABS 175 (L) 06/05/2021  CD4TABS 208 (L) 07/27/2020    RPR and STI Lab Results  Component Value Date   LABRPR NON-REACTIVE 07/12/2021   LABRPR CANCELED 07/27/2020    STI Results GC CT  07/27/2020  4:08 PM Negative  Negative     Hepatitis B Lab Results  Component Value Date   HEPBSAB NON-REACTIVE 07/27/2020   HEPBSAG NON-REACTIVE 07/27/2020   HEPBCAB NON-REACTIVE 07/27/2020   Hepatitis C Lab Results  Component Value Date   HEPCAB NON-REACTIVE 07/27/2020   Hepatitis A Lab Results  Component Value Date   HAV NON-REACTIVE 07/27/2020   Lipids: Lab Results  Component Value Date   CHOL 621 (H) 07/27/2020   TRIG 7,333 (H) 07/27/2020   HDL 24 (L) 07/27/2020   CHOLHDL 25.9 (H) 07/27/2020   [place]  07/27/2020     Comment:     . LDL cholesterol not calculated. Triglyceride levels greater than 400 mg/dL invalidate calculated LDL results. . Reference range:  <100 . Desirable range <100 mg/dL for primary prevention;   <70 mg/dL for patients with CHD or diabetic patients  with > or = 2 CHD risk factors. Marland Kitchen LDL-C is now calculated using the Martin-Hopkins  calculation, which is a validated novel method providing  better accuracy than the Friedewald equation in the  estimation of LDL-C.  Cresenciano Genre et al. Annamaria Helling. 9407;680(88): 2061-2068  (http://education.[facility].com/faq/FAQ164)     TARGET DATE: The 25th of the month  Assessment: Kevin Burke presents today for his maintenance Cabenuva injections. Past injections were tolerated well without issues. He was offered STI screening and condoms which he declined at this time.  Patient was eligible for the COVID and Mpox vaccines. After discussing risks and benefits, he agreed to the Mpox vaccine. He had COVID back in September, so we will hold off on the COVID vaccine today and offer it at his next visit. He asked about the Shingrix vaccine and we explained we cannot provide it here, but we would send a prescription to his preferred pharmacy so he can get it there. The Mpox vaccine was administered SubQ in the right arm.  Administered cabotegravir 658m/3mL in left upper outer quadrant of the gluteal muscle. Administered rilpivirine 900 mg/378min the right upper outer quadrant of the gluteal muscle. No issues with injections. his will follow up in 2 months for next set of injections.  Plan: - Cabenuva injections administered - Administered Mpox vaccine - Collect HIV Viral load, Hep A and B surface Ab test, lipid panel, CD4 and CMET - Next injections scheduled for 06/14/2022 at 10 AM with Cassie - Administer 2nd Mpox vaccine and offer COVID vaccine at next appointment - Call with any issues or questions  [facility] Infectious Disease

## 2022-04-16 ENCOUNTER — Other Ambulatory Visit: Payer: Self-pay

## 2022-04-16 ENCOUNTER — Ambulatory Visit (INDEPENDENT_AMBULATORY_CARE_PROVIDER_SITE_OTHER): Payer: Commercial Managed Care - PPO | Admitting: Pharmacist

## 2022-04-16 ENCOUNTER — Other Ambulatory Visit: Payer: Self-pay | Admitting: Pharmacist

## 2022-04-16 DIAGNOSIS — Z23 Encounter for immunization: Secondary | ICD-10-CM

## 2022-04-16 DIAGNOSIS — B20 Human immunodeficiency virus [HIV] disease: Secondary | ICD-10-CM | POA: Diagnosis not present

## 2022-04-16 MED ORDER — ZOSTER VAC RECOMB ADJUVANTED 50 MCG/0.5ML IM SUSR: 0.5000 mL | Freq: Once | INTRAMUSCULAR | 1 refills | Status: AC

## 2022-04-16 MED ORDER — CABOTEGRAVIR & RILPIVIRINE ER 600 & 900 MG/3ML IM SUER
1.0000 | Freq: Once | INTRAMUSCULAR | Status: AC
  Administered 2022-04-16: 1 via INTRAMUSCULAR

## 2022-04-17 ENCOUNTER — Telehealth: Payer: Self-pay | Admitting: Pharmacist

## 2022-04-17 LAB — T-HELPER CELL (CD4) - (RCID CLINIC ONLY)
CD4 % Helper T Cell: 7 % — ABNORMAL LOW (ref 33–65)
CD4 T Cell Abs: 163 /uL — ABNORMAL LOW (ref 400–1790)

## 2022-04-17 NOTE — Telephone Encounter (Signed)
Called and spoke with patient regarding elevated Triglycerides, cholesterol and blood glucose. After speaking with Rexene Alberts, she recommended the patient return to clinic for a fasting lipid panel and amylase test. The patient informed me over the phone he had already seen the result and scheduled an appointment with his PCP in response to the lab. He states he had recently been binge drinking, however reports no abdominal pain. I advised him to also discuss his high glucose level with his PCP as well and possibly obtain an A1c. 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   Jani Gravel, PharmD PGY-2 Infectious Diseases Resident  04/17/2022 10:06 AM

## 2022-04-17 NOTE — Telephone Encounter (Signed)
Thanks Austin 

## 2022-04-18 ENCOUNTER — Emergency Department (HOSPITAL_COMMUNITY)
Admission: EM | Admit: 2022-04-18 | Discharge: 2022-04-18 | Disposition: A | Discharge status: Home / Self Care | Payer: Commercial Managed Care - PPO | Attending: Emergency Medicine | Admitting: Emergency Medicine

## 2022-04-18 ENCOUNTER — Emergency Department (HOSPITAL_COMMUNITY): Payer: Commercial Managed Care - PPO

## 2022-04-18 ENCOUNTER — Encounter (HOSPITAL_COMMUNITY): Payer: Self-pay

## 2022-04-18 DIAGNOSIS — Z8616 Personal history of COVID-19: Secondary | ICD-10-CM | POA: Diagnosis not present

## 2022-04-18 DIAGNOSIS — R059 Cough, unspecified: Secondary | ICD-10-CM | POA: Diagnosis present

## 2022-04-18 DIAGNOSIS — J069 Acute upper respiratory infection, unspecified: Secondary | ICD-10-CM

## 2022-04-18 DIAGNOSIS — R051 Acute cough: Secondary | ICD-10-CM

## 2022-04-18 DIAGNOSIS — Z20822 Contact with and (suspected) exposure to covid-19: Secondary | ICD-10-CM | POA: Insufficient documentation

## 2022-04-18 LAB — COMPREHENSIVE METABOLIC PANEL
AG Ratio: 1 (calc) (ref 1.0–2.5)
ALT: 12 U/L (ref 9–46)
AST: 14 U/L (ref 10–35)
Albumin: 3.7 g/dL (ref 3.6–5.1)
Alkaline phosphatase (APISO): 96 U/L (ref 35–144)
BUN/Creatinine Ratio: 13 (calc) (ref 6–22)
BUN: 19 mg/dL (ref 7–25)
CO2: 22 mmol/L (ref 20–32)
Calcium: 8.8 mg/dL (ref 8.6–10.3)
Chloride: 95 mmol/L — ABNORMAL LOW (ref 98–110)
Creat: 1.44 mg/dL — ABNORMAL HIGH (ref 0.70–1.30)
Globulin: 3.7 g/dL (calc) (ref 1.9–3.7)
Glucose, Bld: 477 mg/dL — ABNORMAL HIGH (ref 65–99)
Potassium: 4.4 mmol/L (ref 3.5–5.3)
Sodium: 127 mmol/L — ABNORMAL LOW (ref 135–146)
Total Bilirubin: 0.5 mg/dL (ref 0.2–1.2)
Total Protein: 7.4 g/dL (ref 6.1–8.1)

## 2022-04-18 LAB — LIPID PANEL
Cholesterol: 319 mg/dL — ABNORMAL HIGH (ref <200)
HDL: 30 mg/dL — ABNORMAL LOW (ref >=40)
Non-HDL Cholesterol (Calc): 289 mg/dL (calc) — ABNORMAL HIGH (ref <130)
Total CHOL/HDL Ratio: 10.6 (calc) — ABNORMAL HIGH (ref <5.0)
Triglycerides: 2552 mg/dL — ABNORMAL HIGH (ref <150)

## 2022-04-18 LAB — HIV-1 RNA QUANT-NO REFLEX-BLD
HIV 1 RNA Quant: 21 Copies/mL — ABNORMAL HIGH
HIV-1 RNA Quant, Log: 1.32 Log cps/mL — ABNORMAL HIGH

## 2022-04-18 LAB — SARS CORONAVIRUS 2 BY RT PCR: SARS Coronavirus 2 by RT PCR: NEGATIVE

## 2022-04-18 LAB — HEPATITIS A ANTIBODY, TOTAL: Hepatitis A AB,Total: REACTIVE — AB

## 2022-04-18 LAB — HEPATITIS B SURFACE ANTIBODY,QUALITATIVE: Hep B S Ab: NONREACTIVE

## 2022-04-18 MED ORDER — ALBUTEROL SULFATE HFA 108 (90 BASE) MCG/ACT IN AERS
2.0000 | INHALATION_SPRAY | Freq: Once | RESPIRATORY_TRACT | Status: AC
  Administered 2022-04-18: 2 via RESPIRATORY_TRACT
  Filled 2022-04-18: qty 6.7

## 2022-04-18 MED ORDER — BENZONATATE 100 MG PO CAPS: 200.0000 mg | ORAL_CAPSULE | Freq: Three times a day (TID) | ORAL | 0 refills | Status: DC | PRN

## 2022-04-18 MED ORDER — AEROCHAMBER PLUS FLO-VU MEDIUM MISC
1.0000 | Freq: Once | Status: AC
  Administered 2022-04-18: 1

## 2022-04-18 NOTE — Discharge Instructions (Addendum)
Use the albuterol inhaler given and take 2 puffs using the spacer when you recognize you are wheezing.  I have also prescribed you tessalon to help with your cough.  Your chest xray is clear, covid is negative,  your flu test is pending currently but you can check for this result in mychart.  Keep your appointment with your primary MD this afternoon as you have arranged.

## 2022-04-18 NOTE — ED Notes (Signed)
Ambulated pt. Around ED. O2 sat ranged from 91-93% on room air while ambulating. No increased WOB while ambulating noted. Pt. Tolerated well.

## 2022-04-18 NOTE — ED Triage Notes (Signed)
Pt. Bought self to ED complaining of a congested cough since Sunday and states "it feels congested in my lower chest" does note he was covid positive 2 months ago. Notes a productive cough with yellow mucus. No other complaints at this time. Does note he has HTN and b/p is currently elevated at 160/101.

## 2022-04-18 NOTE — ED Provider Notes (Signed)
[facility] EMERGENCY DEPARTMENT Provider Note   CSN: [id number] Arrival date & time: 04/18/22  0848     History  Chief Complaint  Patient presents with   Cough    Kevin Burke is a 51 y.o. male presenting with a congested cough which started 3 days ago.  He describes feeling congestion in his lower chest, has had increased cough, sometimes productive of initially clear but now yellow sputum.  He also denies some nasal congestion, he has had no obvious fevers or chills, he denies chest pain, nausea or vomiting.  Of note, he did have COVID-19 2 months ago, felt completely resolved from this infection until his symptoms started this week.  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  He has taken Mucinex and also started taking Coricidin cold and flu formula yesterday morning.  He does report nighttime wheezing.  The history is provided by the patient.       Home Medications Prior to Admission medications   Medication Sig Start Date End Date Taking? Authorizing Provider  benzonatate (TESSALON) 100 MG capsule Take 2 capsules (200 mg total) by mouth 3 (three) times daily as needed. 04/18/22  Yes Idol, Almyra Free, PA-C  amLODipine (NORVASC) 5 MG tablet 1 tablet 10/27/20   [provider]  atorvastatin (LIPITOR) 80 MG tablet TAKE 1 TABLET BY MOUTH EVERY DAY Patient not taking: Reported on 09/13/2021 09/21/20   Thayer Headings, MD  cabotegravir & rilpivirine ER (CABENUVA) 600 & 900 MG/3ML injection Inject 1 kit into the muscle every 30 (thirty) days for 60 days, THEN 1 kit every 2 (two) months. 09/14/21 11/13/22  Kuppelweiser, Cassie L, RPH-CPP  carvedilol (COREG) 25 MG tablet Take 1 tablet (25 mg total) by mouth 2 (two) times daily with a meal. 07/29/20   Comer, Okey Regal, MD  fenofibrate 160 MG tablet Take 1 tablet (160 mg total) by mouth daily. 07/29/20   Thayer Headings, MD  furosemide (LASIX) 40 MG tablet Take 1 tablet by mouth daily as needed.    [provider]  glipiZIDE (GLUCOTROL) 5 MG tablet Take 1 tablet (5 mg total) by mouth daily before breakfast. 07/29/20   Comer, Okey Regal, MD  Lancets Misc. (PENLET II REPLACEMENT CAP) MISC 1 each by Does not apply route daily. 07/29/20   Comer, Okey Regal, MD  lisinopril (ZESTRIL) 40 MG tablet Take 1 tablet (40 mg total) by mouth daily. 07/29/20   Thayer Headings, MD  Omega 3 1000 MG CAPS 1 capsule    [provider]  Testosterone 20.25 MG/ACT (1.62%) GEL 1 pump to skin in the morning to shoulder, upper arms or abdomen 09/06/21   [provider]      Allergies    Patient has no known allergies.    Review of Systems   Review of Systems  Constitutional:  Positive for chills. Negative for fever.  HENT:  Positive for congestion and voice change. Negative for ear pain, rhinorrhea, sinus pressure, sore throat and trouble swallowing.        Denies sore throat but voice is hoarse this am.  Eyes:  Negative for discharge.  Respiratory:  Positive for cough. Negative for shortness of breath, wheezing and stridor.   Cardiovascular:  Negative for chest pain.  Gastrointestinal:  Negative for abdominal pain, nausea and vomiting.  Genitourinary: Negative.     Physical Exam Updated Vital Signs BP (!) 156/109   Pulse (!) 51   Temp  98.4 F (36.9 C) (Oral)   Resp (!) 21   Ht 5' 6" (1.676 m)   Wt 77.1 kg   SpO2 93%   BMI 27.44 kg/m  Physical Exam Constitutional:      Appearance: He is well-developed.  HENT:     Head: Normocephalic and atraumatic.     Nose: Mucosal edema present. No rhinorrhea.     Mouth/Throat:     Mouth: Mucous membranes are moist.     Pharynx: Oropharynx is clear. Uvula midline. No oropharyngeal exudate or posterior oropharyngeal erythema.     Tonsils: No tonsillar abscesses.  Eyes:     Conjunctiva/sclera: Conjunctivae normal.  Cardiovascular:     Rate and Rhythm: Normal rate.     Heart sounds: Normal heart sounds.  Pulmonary:     Effort: Pulmonary effort is normal. No  respiratory distress.     Breath sounds: Rhonchi present. No wheezing or rales.     Comments: Few rhonchi bilateral lower lung fields, no wheezing presently.  Musculoskeletal:        General: Normal range of motion.  Skin:    General: Skin is warm and dry.     Findings: No rash.  Neurological:     Mental Status: He is alert and oriented to person, place, and time.     ED Results / Procedures / Treatments   Labs (all labs ordered are listed, but only abnormal results are displayed) Labs Reviewed  SARS CORONAVIRUS 2 BY RT PCR  RESP PANEL BY RT-PCR (FLU A&B, COVID) [id number]    EKG EKG Interpretation  Date/Time:  Wednesday April 18 2022 09:23:02 EST Ventricular Rate:  109 PR Interval:  147 QRS Duration: 124 QT Interval:  335 QTC Calculation: 452 R Axis:   48 Text Interpretation: Sinus tachycardia Consider right atrial enlargement LVH with IVCD and secondary repol abnrm Anterior ST elevation, probably due to LVH increased rate from prior 4/23 Confirmed by Aletta Edouard [phone]) on 04/19/2022 9:25:14 AM  Radiology DG Chest Portable 1 View  Result Date: 04/18/2022 CLINICAL DATA:  Cough EXAM: PORTABLE CHEST 1 VIEW COMPARISON:  None Available. FINDINGS: No pleural effusion. No pneumothorax. Normal cardiac and mediastinal contours. No focal airspace opacity. No displaced rib fracture. Visualized upper abdomen is unremarkable. IMPRESSION: No focal airspace opacity. Electronically Signed   By: Marin Roberts M.D.   On: 04/18/2022 09:38    Procedures Procedures    Medications Ordered in ED Medications  albuterol (VENTOLIN HFA) 108 (90 Base) MCG/ACT inhaler 2 puff (2 puffs Inhalation Given 04/18/22 0958)  AeroChamber Plus Flo-Vu Medium MISC 1 each (1 each Other Given 04/18/22 1002)    ED Course/ Medical Decision Making/ A&P                           Medical Decision Making Pt presenting with persistent cough productive of yellow sputum, x 4 days, concern for pneumonia.  Covid  + 2 months ago, but felt well until new illness.  Not relieved by otc meds.  No wheezing on exam, but wheezing, esp at night per pt report.  Few rhonchi on exam therefore proceeded to chest x-ray to rule out pneumonia which was negative.  He was given albuterol inhaler given his stated history of wheezing, could possibly be the source of his cough.  Also prescribed Tessalon Perles to help with cough relief.  Prior to discharge patient became anxious as he actually had an appointment with his primary provider in  an hour and was concerned about making that appointment.  He has been discharged with plans to follow-up with his PCP.  There is no indication for further ED evaluation, he ambulated in the department without desaturation.  Amount and/or Complexity of Data Reviewed Labs: ordered.    Details: Covid negative Radiology: ordered.    Details: Normal chest x-ray, no pneumonia.  Risk Prescription drug management.           Final Clinical Impression(s) / ED Diagnoses Final diagnoses:  Viral upper respiratory tract infection  Acute cough    Rx / DC Orders ED Discharge Orders          Ordered    benzonatate (TESSALON) 100 MG capsule  3 times daily PRN        04/18/22 1210              Evalee Jefferson, PA-C 04/19/22 1630    Milton Ferguson, MD 04/21/22 0740

## 2022-05-01 ENCOUNTER — Other Ambulatory Visit: Payer: Self-pay | Admitting: Pharmacist

## 2022-05-01 DIAGNOSIS — B20 Human immunodeficiency virus [HIV] disease: Secondary | ICD-10-CM

## 2022-05-01 MED ORDER — CABOTEGRAVIR & RILPIVIRINE ER 600 & 900 MG/3ML IM SUER: 1.0000 | INTRAMUSCULAR | 5 refills | Status: DC

## 2022-06-14 ENCOUNTER — Ambulatory Visit (INDEPENDENT_AMBULATORY_CARE_PROVIDER_SITE_OTHER): Payer: Commercial Managed Care - PPO | Admitting: Pharmacist

## 2022-06-14 ENCOUNTER — Ambulatory Visit (INDEPENDENT_AMBULATORY_CARE_PROVIDER_SITE_OTHER): Payer: Commercial Managed Care - PPO

## 2022-06-14 ENCOUNTER — Other Ambulatory Visit: Payer: Self-pay

## 2022-06-14 DIAGNOSIS — Z23 Encounter for immunization: Secondary | ICD-10-CM | POA: Diagnosis not present

## 2022-06-14 DIAGNOSIS — B20 Human immunodeficiency virus [HIV] disease: Secondary | ICD-10-CM | POA: Diagnosis not present

## 2022-06-14 MED ORDER — CABOTEGRAVIR & RILPIVIRINE ER 600 & 900 MG/3ML IM SUER
1.0000 | Freq: Once | INTRAMUSCULAR | Status: AC
  Administered 2022-06-14: 1 via INTRAMUSCULAR

## 2022-06-14 NOTE — Progress Notes (Signed)
HPI: Kevin Burke is a 52 y.o. male who presents to the [facility] for [place] administration.  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   CABOTEGRAVIR & RILPIVIRINE ER (CABENUVA) 600 & 900 MG/3ML INJECTION    Inject 1 kit into the muscle every 2 (two) months.   CARVEDILOL (COREG) 25 MG TABLET    Take 1 tablet (25 mg total) by mouth 2 (two) times daily with a meal.   FENOFIBRATE 160 MG TABLET    Take 1 tablet (160 mg total) by mouth daily.   FUROSEMIDE (LASIX) 40 MG TABLET    Take 1 tablet by mouth daily as needed.   GLIPIZIDE (GLUCOTROL) 5 MG TABLET    Take 1 tablet (5 mg total) by mouth daily before breakfast.   LANCETS MISC. (PENLET II REPLACEMENT CAP) MISC    1 each by Does not apply route daily.   LISINOPRIL (ZESTRIL) 40 MG TABLET    Take 1 tablet (40 mg total) by mouth daily.   OMEGA 3 1000 MG CAPS    1 capsule   TESTOSTERONE 20.25 MG/ACT (1.62%) GEL    1 pump to skin in the morning to shoulder, upper arms or abdomen  Modified Medications   No medications on file  Discontinued Medications   No medications on file    Allergies: No Known Allergies  Past Medical History: Past Medical History:  Diagnosis Date   HIV (human immunodeficiency virus infection) ([place])    Hypertension    Type 2 diabetes mellitus  ([place])     Social History: Social History   Socioeconomic History   Marital status: Single    Spouse name: Not on file   Number of children: Not on file   Years of education: Not on file   Highest education level: Not on file  Occupational History   Not on file  Tobacco Use   Smoking status: Every Day    Packs/day: 0.10    Types: Cigarettes   Smokeless tobacco: Never   Tobacco comments:    Less than 5 per day  Substance and Sexual Activity   Alcohol use: Yes    Comment: seldom   Drug use: Not Currently   Sexual activity: Not Currently    Partners: Male  Other Topics Concern   Not on file  Social History Narrative   Not on file   Social Determinants of Health   Financial Resource Strain: Not on file  Food Insecurity: Not on file  Transportation Needs: Not on file  Physical Activity: Not on file  Stress: Not on file  Social Connections: Not on file    Labs: Lab Results  Component Value Date   HIV1RNAQUANT 21 (H) 04/16/2022   HIV1RNAQUANT Not Detected 02/16/2022   HIV1RNAQUANT 29 (H) 12/12/2021  CD4TABS 163 (L) 04/16/2022   CD4TABS 210 (L) 10/16/2021   CD4TABS 175 (L) 06/05/2021    RPR and STI Lab Results  Component Value Date   LABRPR NON-REACTIVE 07/12/2021   LABRPR CANCELED 07/27/2020    STI Results GC CT  07/27/2020  4:08 PM Negative  Negative     Hepatitis B Lab Results  Component Value Date   HEPBSAB NON-REACTIVE 04/16/2022   HEPBSAG NON-REACTIVE 07/27/2020   HEPBCAB NON-REACTIVE 07/27/2020   Hepatitis C Lab Results  Component Value Date   HEPCAB NON-REACTIVE 07/27/2020   Hepatitis A Lab Results  Component Value Date   HAV REACTIVE (A) 04/16/2022   Lipids: Lab Results  Component Value Date   CHOL 319 (H) 04/16/2022   TRIG 2,552 (H) 04/16/2022   HDL 30 (L) 04/16/2022   CHOLHDL 10.6 (H) 04/16/2022   LDLCALC  04/16/2022     Comment:     . LDL cholesterol not calculated. Triglyceride levels greater than 400 mg/dL invalidate  calculated LDL results. . Reference range: <100 . Desirable range <100 mg/dL for primary prevention;   <70 mg/dL for patients with CHD or diabetic patients  with > or = 2 CHD risk factors. Marland Kitchen LDL-C is now calculated using the Martin-Hopkins  calculation, which is a validated novel method providing  better accuracy than the Friedewald equation in the  estimation of LDL-C.  Cresenciano Genre et al. Annamaria Helling. 4098;119(14): 2061-2068  (http://education.[facility].com/faq/FAQ164)     TARGET DATE: The 25th  Assessment: Kevin Burke presents today for his maintenance Cabenuva injections. Past injections were tolerated well without issues. He politely declines STI testing today. He recently started Ozempic ~1 month ago and has lost 15 lbs so far. He is hopeful that the weight loss will help his cholesterol levels and diabetes. No concerns today.  Administered cabotegravir 600mg /87mL in left upper outer quadrant of the gluteal muscle. Administered rilpivirine 900 mg/65mL in the right upper outer quadrant of the gluteal muscle. No issues with injections. He will follow up in 2 months for next set of injections.  Colletta Maryland is unavailable during his next injection window in March. Will try to get him scheduled with her in May. His CD4 count was <200 back in November. He had a viral illness at that time so will recheck today to make sure it is trending up. Will also check a HIV RNA today as well. It has been 90 days since he had COVID so will administer the vaccine today too.   Plan: - Cabenuva injections administered - HIV RNA and CD4 count - COVID vaccine today - Next injections scheduled for 08/16/22 - Call with any issues or questions  Adin Laker L. Seaborn Nakama, PharmD, BCIDP, AAHIVP, [place] Clinical Pharmacist Practitioner [place] for Infectious Disease

## 2022-06-15 ENCOUNTER — Encounter: Payer: Self-pay | Admitting: Pharmacist

## 2022-06-15 LAB — T-HELPER CELL (CD4) - (RCID CLINIC ONLY)
CD4 % Helper T Cell: 8 % — ABNORMAL LOW (ref 33–65)
CD4 T Cell Abs: 185 /uL — ABNORMAL LOW (ref 400–1790)

## 2022-06-15 NOTE — Progress Notes (Signed)
Should we add PCP ppx?

## 2022-06-15 NOTE — Progress Notes (Signed)
I think he probably needs to yes - not sure why he is declined so much but he has never really been good from when I look back at Rob's notes.  I would try to get his next appointment with Rob so he can get an eye on him. Looks like it's been a while since he saw him.

## 2022-06-19 LAB — HIV-1 RNA QUANT-NO REFLEX-BLD
HIV 1 RNA Quant: <20 Copies/mL — ABNORMAL HIGH
HIV-1 RNA Quant, Log: <1.3 Log cps/mL — ABNORMAL HIGH

## 2022-06-25 ENCOUNTER — Telehealth: Payer: Commercial Managed Care - PPO

## 2022-06-25 ENCOUNTER — Telehealth: Payer: Self-pay

## 2022-06-25 NOTE — Telephone Encounter (Signed)
Patient called stating that his T-cell count has remained pretty low but lately he has had colds about every 3 weeks and recently had COVID. Patient wanted to know if Dr.Comer recommended medication to take to prevent infections.   Ages, CMA

## 2022-06-25 NOTE — Telephone Encounter (Signed)
Patient aware and voiced his understanding.    

## 2022-07-04 ENCOUNTER — Other Ambulatory Visit: Payer: Self-pay | Admitting: Internal Medicine

## 2022-07-04 ENCOUNTER — Ambulatory Visit
Admission: RE | Admit: 2022-07-04 | Discharge: 2022-07-04 | Disposition: A | Discharge status: Home / Self Care | Payer: Commercial Managed Care - PPO | Source: Ambulatory Visit | Attending: Internal Medicine | Admitting: Internal Medicine

## 2022-07-04 DIAGNOSIS — R053 Chronic cough: Secondary | ICD-10-CM

## 2022-07-30 ENCOUNTER — Other Ambulatory Visit (HOSPITAL_COMMUNITY): Payer: Self-pay

## 2022-07-31 ENCOUNTER — Other Ambulatory Visit: Payer: Self-pay

## 2022-07-31 ENCOUNTER — Other Ambulatory Visit: Payer: Self-pay | Admitting: Pharmacist

## 2022-07-31 ENCOUNTER — Other Ambulatory Visit (HOSPITAL_COMMUNITY): Payer: Self-pay

## 2022-07-31 ENCOUNTER — Ambulatory Visit: Payer: Commercial Managed Care - PPO | Admitting: Internal Medicine

## 2022-07-31 ENCOUNTER — Encounter: Payer: Self-pay | Admitting: Internal Medicine

## 2022-07-31 ENCOUNTER — Telehealth: Payer: Self-pay

## 2022-07-31 VITALS — BP 144/97 | HR 101 | Temp 98.0°F | Wt 173.2 lb

## 2022-07-31 DIAGNOSIS — I10 Essential (primary) hypertension: Secondary | ICD-10-CM

## 2022-07-31 DIAGNOSIS — B20 Human immunodeficiency virus [HIV] disease: Secondary | ICD-10-CM

## 2022-07-31 DIAGNOSIS — Z720 Tobacco use: Secondary | ICD-10-CM

## 2022-07-31 MED ORDER — CABOTEGRAVIR & RILPIVIRINE ER 600 & 900 MG/3ML IM SUER
1.0000 | INTRAMUSCULAR | 5 refills | Status: DC
  Filled 2022-07-31: qty 6, 60d supply, fill #0
  Filled 2022-07-31: qty 6, 30d supply, fill #0
  Filled 2022-10-12: qty 6, 30d supply, fill #1
  Filled 2022-12-04 – 2022-12-06 (×2): qty 6, 30d supply, fill #2
  Filled 2023-01-21: qty 6, 30d supply, fill #3
  Filled 2023-04-01: qty 6, 30d supply, fill #4
  Filled 2023-05-27: qty 6, 30d supply, fill #5

## 2022-07-31 NOTE — Progress Notes (Signed)
   Subjective:    Patient ID: Kevin Burke, male    DOB: [birth year], 52 y.o.   MRN: [id number]  HPI [name] is here for follow up of HIV and Cabenuva injection He is doing well though CD4 has remained below 200 but viral load <20 last visit.  No complaints today.  He is on ozempic and doing much better with good weight loss and his Hgb A1c has declined from 14 to about 7.  He was given Bactrim by his PCP for opportunistic infection prophylaxis.     Review of Systems  Constitutional:  Negative for fatigue.  Gastrointestinal:  Negative for diarrhea.  Skin:  Negative for rash.       Objective:   Physical Exam Eyes:     General: No scleral icterus. Pulmonary:     Effort: Pulmonary effort is normal.  Skin:    Findings: No rash.  Neurological:     Mental Status: He is alert.   SH: + tobacco        Assessment & Plan:

## 2022-07-31 NOTE — Assessment & Plan Note (Signed)
Elevated today and rechecked.  He will take his am medicine after this and monitor at home

## 2022-07-31 NOTE — Telephone Encounter (Signed)
[facility] Patient Advocate Encounter  Prior Authorization for Kern Reap has been approved.  (Pharmacy Benefits)  PA# [id number] Effective dates: 07/30/22 through 07/30/23  Patients co-pay is $0.00.   [facility] will continue to follow.  [name], [facility] Patient [facility] for Infect[facility] Phone: [phone] Fax:  [phone]

## 2022-07-31 NOTE — Assessment & Plan Note (Signed)
counseled

## 2022-08-01 ENCOUNTER — Telehealth: Payer: Self-pay | Admitting: Pharmacist

## 2022-08-01 NOTE — Telephone Encounter (Signed)
Patient's specialty medication [name] was delivered from [facility] and will be administered at next office visit on 3/21.  Alfonse Spruce, PharmD, CPP, BCIDP, [place] Clinical Pharmacist Practitioner Infectious [place] for Infectious Disease

## 2022-08-01 NOTE — Telephone Encounter (Signed)
Cabenuva replacing what was administered on 3/5.  Alfonse Spruce, PharmD, CPP, BCIDP, [place] Clinical Pharmacist Practitioner Infectious [place] for Infectious Disease

## 2022-08-15 ENCOUNTER — Other Ambulatory Visit: Payer: Self-pay | Admitting: Pharmacist

## 2022-08-15 ENCOUNTER — Ambulatory Visit: Payer: Commercial Managed Care - PPO | Admitting: Internal Medicine

## 2022-08-16 ENCOUNTER — Other Ambulatory Visit: Payer: Self-pay

## 2022-08-16 ENCOUNTER — Ambulatory Visit (INDEPENDENT_AMBULATORY_CARE_PROVIDER_SITE_OTHER): Payer: Commercial Managed Care - PPO | Admitting: Pharmacist

## 2022-08-16 ENCOUNTER — Encounter: Payer: Commercial Managed Care - PPO | Admitting: Pharmacist

## 2022-08-16 DIAGNOSIS — B20 Human immunodeficiency virus [HIV] disease: Secondary | ICD-10-CM | POA: Diagnosis not present

## 2022-08-16 MED ORDER — CABOTEGRAVIR & RILPIVIRINE ER 600 & 900 MG/3ML IM SUER
1.0000 | Freq: Once | INTRAMUSCULAR | Status: AC
  Administered 2022-08-16: 1 via INTRAMUSCULAR

## 2022-08-16 NOTE — Progress Notes (Signed)
HPI: Kevin Burke is a 52 y.o. male who presents to the [place] clinic for [place] administration.  Patient Active Problem List   Diagnosis Date Noted   COVID-19 virus infection 02/12/2022   Tobacco abuse 09/13/2021   Routine screening for STI (sexually transmitted infection) 07/12/2021   Hypertriglyceridemia 08/18/2020   HTN (hypertension) 08/17/2020   Human immunodeficiency virus (HIV) disease ([place]) 07/29/2020   DM type 2, uncontrolled, with renal complications [id number]    Patient's Medications  New Prescriptions   No medications on file  Previous Medications   AMLODIPINE (NORVASC) 5 MG TABLET    1 tablet   ATORVASTATIN (LIPITOR) 80 MG TABLET    TAKE 1 TABLET BY MOUTH EVERY DAY   BUDESONIDE-FORMOTEROL (SYMBICORT) 160-4.5 MCG/ACT INHALER    Inhale into the lungs.   CABOTEGRAVIR & RILPIVIRINE ER (CABENUVA) 600 & 900 MG/3ML INJECTION    Inject 1 kit into the muscle every 2 (two) months.   CARVEDILOL (COREG) 25 MG TABLET    Take 1 tablet (25 mg total) by mouth 2 (two) times daily with a meal.   FENOFIBRATE 160 MG TABLET    Take 1 tablet (160 mg total) by mouth daily.   FLUTICASONE (FLONASE) 50 MCG/ACT NASAL SPRAY    Place into both nostrils.   FUROSEMIDE (LASIX) 40 MG TABLET    Take 1 tablet by mouth daily as needed.   GLIPIZIDE (GLUCOTROL) 5 MG TABLET    Take 1 tablet (5 mg total) by mouth daily before breakfast.   GLUCOSE BLOOD (ONETOUCH VERIO) TEST STRIP    as directed In Vitro as directed for 90 days   LANCETS MISC. (PENLET II REPLACEMENT CAP) MISC    1 each by Does not apply route daily.   LISINOPRIL (ZESTRIL) 40 MG TABLET    Take 1 tablet (40 mg total) by mouth daily.   OMEGA 3 1000 MG CAPS    1 capsule   OZEMPIC, 0.25 OR 0.5 MG/DOSE, 2 MG/3ML SOPN    Inject into the skin.   SULFAMETHOXAZOLE-TRIMETHOPRIM (BACTRIM DS) 800-160 MG TABLET    Take 1 tablet by mouth 2 (two) times daily.   TESTOSTERONE 20.25 MG/ACT (1.62%) GEL    1 pump to skin in the morning to shoulder,  upper arms or abdomen   TOUJEO SOLOSTAR 300 UNIT/ML SOLOSTAR PEN    take 10 units in the morning Subcutaneous once daily  Modified Medications   No medications on file  Discontinued Medications   No medications on file    Allergies: Allergies  Allergen Reactions   Bactrim Ds [Sulfamethoxazole-Trimethoprim] Hives    Hives/ itching     Past Medical History: Past Medical History:  Diagnosis Date   HIV (human immunodeficiency virus infection) ([place])    Hypertension    Type 2 diabetes mellitus ([place])     Social History: Social History   Socioeconomic History   Marital status: Single    Spouse name: Not on file   Number of children: Not on file   Years of education: Not on file   Highest education level: Not on file  Occupational History   Not on file  Tobacco Use   Smoking status: Every Day    Packs/day: .1    Types: Cigarettes   Smokeless tobacco: Never   Tobacco comments:    Less than 5 per day  Substance and Sexual Activity   Alcohol use: Yes    Comment: seldom   Drug use: Not Currently   Sexual  activity: Not Currently    Partners: Male  Other Topics Concern   Not on file  Social History Narrative   Not on file   Social Determinants of Health   Financial Resource Strain: Not on file  Food Insecurity: Not on file  Transportation Needs: Not on file  Physical Activity: Not on file  Stress: Not on file  Social Connections: Not on file    Labs: Lab Results  Component Value Date   HIV1RNAQUANT <20 (H) 06/14/2022   HIV1RNAQUANT 21 (H) 04/16/2022   HIV1RNAQUANT Not Detected 02/16/2022   CD4TABS 185 (L) 06/14/2022   CD4TABS 163 (L) 04/16/2022   CD4TABS 210 (L) 10/16/2021    RPR and STI Lab Results  Component Value Date   LABRPR NON-REACTIVE 07/12/2021   LABRPR CANCELED 07/27/2020    STI Results GC CT  07/27/2020  4:08 PM Negative  Negative     Hepatitis B Lab Results  Component Value Date   HEPBSAB NON-REACTIVE 04/16/2022   HEPBSAG  NON-REACTIVE 07/27/2020   HEPBCAB NON-REACTIVE 07/27/2020   Hepatitis C Lab Results  Component Value Date   HEPCAB NON-REACTIVE 07/27/2020   Hepatitis A Lab Results  Component Value Date   HAV REACTIVE (A) 04/16/2022   Lipids: Lab Results  Component Value Date   CHOL 319 (H) 04/16/2022   TRIG 2,552 (H) 04/16/2022   HDL 30 (L) 04/16/2022   CHOLHDL 10.6 (H) 04/16/2022   [place]  04/16/2022     Comment:     . LDL cholesterol not calculated. Triglyceride levels greater than 400 mg/dL invalidate calculated LDL results. . Reference range: <100 . Desirable range <100 mg/dL for primary prevention;   <70 mg/dL for patients with CHD or diabetic patients  with > or = 2 CHD risk factors. Marland Kitchen LDL-C is now calculated using the Martin-Hopkins  calculation, which is a validated novel method providing  better accuracy than the Friedewald equation in the  estimation of LDL-C.  Cresenciano Genre et al. Annamaria Helling. MU:[id number]): 2061-2068  (http://education.[facility].com/faq/FAQ164)     TARGET DATE: The 25th  Assessment: Kevin Burke presents today for his maintenance Cabenuva injections. Past injections were tolerated well without issues. Patient politely declines STI testing today. Will get a CD4 count today as it has been <200 for the last two appointments (163>>185). His HIV RNA was last checked in January and was undetectable, so we will defer this lab for now. Although his CD4 is <200, we will defer OI prophylaxis for now given his undetectable HIV RNA. His PCP prescribed Bactrim on 07/04/22 x7 days for OI prophylaxis, however, the patient has a recorded allergy described as hives/itching. The patient reported taking a couple of doses of Bactrim (not realizing this was the medication he had an allergy to in the past) and developed hives/itching as a result. He stopped taking the medication when the hives developed.   The patient is eligible for the 2nd Mpox vaccine as well as restarting the HepB  series given his non-reactive HepB titers. He politely declines vaccination today.   Administered cabotegravir 600mg /66mL in left upper outer quadrant of the gluteal muscle. Administered rilpivirine 900 mg/62mL in the right upper outer quadrant of the gluteal muscle. No issues with injections. He will follow up in 2 months for next set of injections.    Plan: - Cabenuva injections administered  - Get CD4 count today  - Next injections scheduled for 10/23/22 with Colletta Maryland and 12/13/22 with Cassie  - Call with any issues or questions   Dominica  Rosana Hoes, PharmD PGY1 Pharmacy Resident 3/21/20248:46 AM

## 2022-08-17 LAB — T-HELPER CELLS (CD4) COUNT (NOT AT ARMC)
CD4 % Helper T Cell: 8 % — ABNORMAL LOW (ref 33–65)
CD4 T Cell Abs: 255 /uL — ABNORMAL LOW (ref 400–1790)

## 2022-10-12 ENCOUNTER — Other Ambulatory Visit (HOSPITAL_COMMUNITY): Payer: Self-pay

## 2022-10-12 ENCOUNTER — Other Ambulatory Visit: Payer: Self-pay

## 2022-10-15 ENCOUNTER — Telehealth: Payer: Self-pay

## 2022-10-15 NOTE — Telephone Encounter (Signed)
[facility] Patient Advocate Encounter  Patient's medication (Cabenuva) have been couriered to [facility] from [facility] and will be administered on the patient next office visit on 10/23/22.  Doneta Bayman , CPhT Specialty Pharmacy Patient Advocate [facility] Phone: [phone] Fax:  [phone]  

## 2022-10-22 NOTE — Progress Notes (Unsigned)
HPI: Kevin Burke is a 52 y.o. male who presents to the [facility] pharmacy clinic for [employer] administration.  Patient Active Problem List   Diagnosis Date Noted   COVID-19 virus infection 02/12/2022   Tobacco abuse 09/13/2021   Routine screening for STI (sexually transmitted infection) 07/12/2021   Hypertriglyceridemia 08/18/2020   HTN (hypertension) 08/17/2020   Human immunodeficiency virus (HIV) disease ([facility]) 07/29/2020   DM type 2, uncontrolled, with renal complications 07/29/2020    Patient's Medications  New Prescriptions   No medications on file  Previous Medications   AMLODIPINE (NORVASC) 5 MG TABLET    1 tablet   ATORVASTATIN (LIPITOR) 80 MG TABLET    TAKE 1 TABLET BY MOUTH EVERY DAY   BUDESONIDE-FORMOTEROL (SYMBICORT) 160-4.5 MCG/ACT INHALER    Inhale into the lungs.   CABOTEGRAVIR & RILPIVIRINE ER (CABENUVA) 600 & 900 MG/3ML INJECTION    Inject 1 kit into the muscle every 2 (two) months.   CARVEDILOL (COREG) 25 MG TABLET    Take 1 tablet (25 mg total) by mouth 2 (two) times daily with a meal.   FENOFIBRATE 160 MG TABLET    Take 1 tablet (160 mg total) by mouth daily.   FLUTICASONE (FLONASE) 50 MCG/ACT NASAL SPRAY    Place into both nostrils.   FUROSEMIDE (LASIX) 40 MG TABLET    Take 1 tablet by mouth daily as needed.   GLIPIZIDE (GLUCOTROL) 5 MG TABLET    Take 1 tablet (5 mg total) by mouth daily before breakfast.   GLUCOSE BLOOD (ONETOUCH VERIO) TEST STRIP    as directed In Vitro as directed for 90 days   LANCETS MISC. (PENLET II REPLACEMENT CAP) MISC    1 each by Does not apply route daily.   LISINOPRIL (ZESTRIL) 40 MG TABLET    Take 1 tablet (40 mg total) by mouth daily.   OMEGA 3 1000 MG CAPS    1 capsule   OZEMPIC, 0.25 OR 0.5 MG/DOSE, 2 MG/3ML SOPN    Inject into the skin.   TESTOSTERONE 20.25 MG/ACT (1.62%) GEL    1 pump to skin in the morning to shoulder, upper arms or abdomen   TOUJEO SOLOSTAR 300 UNIT/ML SOLOSTAR PEN    take 10 units in the morning Subcutaneous  once daily  Modified Medications   No medications on file  Discontinued Medications   No medications on file    Allergies: Allergies  Allergen Reactions   Bactrim Ds [Sulfamethoxazole-Trimethoprim] Hives    Hives/ itching     Past Medical History: Past Medical History:  Diagnosis Date   HIV (human immunodeficiency virus infection) ([facility])    Hypertension    Type 2 diabetes mellitus (H[facility])     Social History: Social History   Socioeconomic History   Marital status: Single    Spouse name: Not on file   Number of children: Not on file   Years of education: Not on file   Highest education level: Not on file  Occupational History   Not on file  Tobacco Use   Smoking status: Every Day    Packs/day: .1    Types: Cigarettes   Smokeless tobacco: Never   Tobacco comments:    Less than 5 per day  Substance and Sexual Activity   Alcohol use: Yes    Comment: seldom   Drug use: Not Currently   Sexual activity: Not Currently    Partners: Male  Other Topics Concern   Not on file  Social History  Narrative   Not on file   Social Determinants of Health   Financial Resource Strain: Not on file  Food Insecurity: Not on file  Transportation Needs: Not on file  Physical Activity: Not on file  Stress: Not on file  Social Connections: Not on file    Labs: Lab Results  Component Value Date   HIV1RNAQUANT <20 (H) 06/14/2022   HIV1RNAQUANT 21 (H) 04/16/2022   HIV1RNAQUANT Not Detected 02/16/2022   CD4TABS 255 (L) 08/16/2022   CD4TABS 185 (L) 06/14/2022   CD4TABS 163 (L) 04/16/2022    RPR and STI Lab Results  Component Value Date   LABRPR NON-REACTIVE 07/12/2021   LABRPR CANCELED 07/27/2020    STI Results GC CT  07/27/2020  4:08 PM Negative  Negative     Hepatitis B Lab Results  Component Value Date   HEPBSAB NON-REACTIVE 04/16/2022   HEPBSAG NON-REACTIVE 07/27/2020   HEPBCAB NON-REACTIVE 07/27/2020   Hepatitis C Lab Results  Component Value Date   HEPCAB  NON-REACTIVE 07/27/2020   Hepatitis A Lab Results  Component Value Date   HAV REACTIVE (A) 04/16/2022   Lipids: Lab Results  Component Value Date   CHOL 319 (H) 04/16/2022   TRIG 2,552 (H) 04/16/2022   HDL 30 (L) 04/16/2022   CHOLHDL 10.6 (H) 04/16/2022   LDLCALC  04/16/2022     Comment:     . LDL cholesterol not calculated. Triglyceride levels greater than 400 mg/dL invalidate calculated LDL results. . Reference range: <100 . Desirable range <100 mg/dL for primary prevention;   <70 mg/dL for patients with CHD or diabetic patients  with > or = 2 CHD risk factors. Marland Kitchen LDL-C is now calculated using the Martin-Hopkins  calculation, which is a validated novel method providing  better accuracy than the Friedewald equation in the  estimation of LDL-C.  Horald Pollen et al. Lenox Ahr. 1610;960(45): 2061-2068  (http://education.[facility].com/faq/FAQ164)     TARGET DATE: The 25th  Assessment: Kevin Burke presents today for his maintenance Cabenuva injections. Past injections were tolerated well. He mentioned that he did experience more injection site pain after his most recent administration than in the past. Recommended using a warm/cold compress at the site and OTC pain medications (Tylenol/Motrin). No new partners since last visit.  No known exposures to any STIs since last visit. Politely declines STI testing today.  Last CD4 count was >200 at 255 in 08/16/22. His HIV RNA was last checked in January and was undetectable. Will obtain both HIV RNA and CD4 count today.   The patient is eligible for the 2nd Mpox vaccine as well as restarting the HepB series given his non-reactive HepB titers. He would like to hold off on getting these today as he is scheduled to work tomorrow.   Administered cabotegravir 600mg /24mL in left upper outer quadrant of the gluteal muscle. Administered rilpivirine 900 mg/2mL in the right upper outer quadrant of the gluteal muscle. No issues with injections. He will  follow up in 2 months for next set of injections.    Plan: - Cabenuva injections administered  - Get CD4 count and HIV RNA today - Next injections scheduled for 12/13/22 with Cassie and 02/14/23 with Dr. Luciana Axe - Call with any issues or questions   Irish Elders, PharmD PGY-1 [facility] Pharmacy Resident

## 2022-10-23 ENCOUNTER — Ambulatory Visit: Payer: Commercial Managed Care - PPO | Admitting: Infectious Diseases

## 2022-10-24 ENCOUNTER — Other Ambulatory Visit: Payer: Self-pay

## 2022-10-24 ENCOUNTER — Ambulatory Visit (INDEPENDENT_AMBULATORY_CARE_PROVIDER_SITE_OTHER): Payer: Commercial Managed Care - PPO | Admitting: Pharmacist

## 2022-10-24 DIAGNOSIS — B20 Human immunodeficiency virus [HIV] disease: Secondary | ICD-10-CM

## 2022-10-24 DIAGNOSIS — Z113 Encounter for screening for infections with a predominantly sexual mode of transmission: Secondary | ICD-10-CM

## 2022-10-24 MED ORDER — CABOTEGRAVIR & RILPIVIRINE ER 600 & 900 MG/3ML IM SUER
1.0000 | Freq: Once | INTRAMUSCULAR | Status: AC
  Administered 2022-10-24: 1 via INTRAMUSCULAR

## 2022-10-26 LAB — HIV-1 RNA QUANT-NO REFLEX-BLD
HIV 1 RNA Quant: <20 Copies/mL — ABNORMAL HIGH
HIV-1 RNA Quant, Log: <1.3 Log cps/mL — ABNORMAL HIGH

## 2022-10-26 LAB — T-HELPER CELLS (CD4) COUNT (NOT AT ARMC)
CD4 % Helper T Cell: 9 % — ABNORMAL LOW (ref 33–65)
CD4 T Cell Abs: 234 /uL — ABNORMAL LOW (ref 400–1790)

## 2022-12-04 ENCOUNTER — Other Ambulatory Visit (HOSPITAL_COMMUNITY): Payer: Self-pay

## 2022-12-05 ENCOUNTER — Other Ambulatory Visit (HOSPITAL_COMMUNITY): Payer: Self-pay

## 2022-12-06 ENCOUNTER — Other Ambulatory Visit (HOSPITAL_COMMUNITY): Payer: Self-pay

## 2022-12-06 ENCOUNTER — Other Ambulatory Visit: Payer: Self-pay

## 2022-12-07 ENCOUNTER — Telehealth: Payer: Self-pay

## 2022-12-07 NOTE — Telephone Encounter (Signed)
[facility] Patient Advocate Encounter  Patient's medication Renaldo Harrison) have been couriered to [facility] from [facility] and will be administered on the patient next office visit on 12/13/22.  Clearance Coots , CPhT Specialty Pharmacy Patient [facility] for [facility] Phone: [phone] Fax:  [phone]

## 2022-12-12 ENCOUNTER — Other Ambulatory Visit: Payer: Self-pay

## 2022-12-12 ENCOUNTER — Ambulatory Visit (INDEPENDENT_AMBULATORY_CARE_PROVIDER_SITE_OTHER): Payer: Commercial Managed Care - PPO | Admitting: Pharmacist

## 2022-12-12 DIAGNOSIS — B20 Human immunodeficiency virus [HIV] disease: Secondary | ICD-10-CM

## 2022-12-12 MED ORDER — CABOTEGRAVIR & RILPIVIRINE ER 600 & 900 MG/3ML IM SUER
1.0000 | Freq: Once | INTRAMUSCULAR | Status: AC
  Administered 2022-12-12: 1 via INTRAMUSCULAR

## 2022-12-12 NOTE — Progress Notes (Signed)
HPI: ABISHAI VIEGAS is a 52 y.o. male who presents to the [facility] pharmacy clinic for [place] administration.  Patient Active Problem List   Diagnosis Date Noted   COVID-19 virus infection 02/12/2022   Tobacco abuse 09/13/2021   Routine screening for STI (sexually transmitted infection) 07/12/2021   Hypertriglyceridemia 08/18/2020   HTN (hypertension) 08/17/2020   Human immunodeficiency virus (HIV) disease (HCC) 07/29/2020   DM type 2, uncontrolled, with renal complications 07/29/2020    Patient's Medications  New Prescriptions   No medications on file  Previous Medications   AMLODIPINE (NORVASC) 5 MG TABLET    1 tablet   ATORVASTATIN (LIPITOR) 80 MG TABLET    TAKE 1 TABLET BY MOUTH EVERY DAY   BUDESONIDE-FORMOTEROL (SYMBICORT) 160-4.5 MCG/ACT INHALER    Inhale into the lungs.   CABOTEGRAVIR & RILPIVIRINE ER (CABENUVA) 600 & 900 MG/3ML INJECTION    Inject 1 kit into the muscle every 2 (two) months.   CARVEDILOL (COREG) 25 MG TABLET    Take 1 tablet (25 mg total) by mouth 2 (two) times daily with a meal.   FENOFIBRATE 160 MG TABLET    Take 1 tablet (160 mg total) by mouth daily.   FLUTICASONE (FLONASE) 50 MCG/ACT NASAL SPRAY    Place into both nostrils.   FUROSEMIDE (LASIX) 40 MG TABLET    Take 1 tablet by mouth daily as needed.   GLIPIZIDE (GLUCOTROL) 5 MG TABLET    Take 1 tablet (5 mg total) by mouth daily before breakfast.   GLUCOSE BLOOD (ONETOUCH VERIO) TEST STRIP    as directed In Vitro as directed for 90 days   LANCETS MISC. (PENLET II REPLACEMENT CAP) MISC    1 each by Does not apply route daily.   LISINOPRIL (ZESTRIL) 40 MG TABLET    Take 1 tablet (40 mg total) by mouth daily.   OMEGA 3 1000 MG CAPS    1 capsule   OZEMPIC, 0.25 OR 0.5 MG/DOSE, 2 MG/3ML SOPN    Inject into the skin.   TESTOSTERONE 20.25 MG/ACT (1.62%) GEL    1 pump to skin in the morning to shoulder, upper arms or abdomen   TOUJEO SOLOSTAR 300 UNIT/ML SOLOSTAR PEN    take 10 units in the morning Subcutaneous  once daily  Modified Medications   No medications on file  Discontinued Medications   No medications on file    Allergies: Allergies  Allergen Reactions   Bactrim Ds [Sulfamethoxazole-Trimethoprim] Hives    Hives/ itching     Labs: Lab Results  Component Value Date   HIV1RNAQUANT <20 (H) 10/24/2022   HIV1RNAQUANT <20 (H) 06/14/2022   HIV1RNAQUANT 21 (H) 04/16/2022   CD4TABS 234 (L) 10/24/2022   CD4TABS 255 (L) 08/16/2022   CD4TABS 185 (L) 06/14/2022    RPR and STI Lab Results  Component Value Date   LABRPR NON-REACTIVE 07/12/2021   LABRPR CANCELED 07/27/2020    STI Results GC CT  07/27/2020  4:08 PM Negative  Negative     Hepatitis B Lab Results  Component Value Date   HEPBSAB NON-REACTIVE 04/16/2022   HEPBSAG NON-REACTIVE 07/27/2020   HEPBCAB NON-REACTIVE 07/27/2020   Hepatitis C Lab Results  Component Value Date   HEPCAB NON-REACTIVE 07/27/2020   Hepatitis A Lab Results  Component Value Date   HAV REACTIVE (A) 04/16/2022   Lipids: Lab Results  Component Value Date   CHOL 319 (H) 04/16/2022   TRIG 2,552 (H) 04/16/2022   HDL 30 (L) 04/16/2022  CHOLHDL 10.6 (H) 04/16/2022   LDLCALC  04/16/2022     Comment:     . LDL cholesterol not calculated. Triglyceride levels greater than 400 mg/dL invalidate calculated LDL results. . Reference range: <100 . Desirable range <100 mg/dL for primary prevention;   <70 mg/dL for patients with CHD or diabetic patients  with > or = 2 CHD risk factors. Marland Kitchen LDL-C is now calculated using the Martin-Hopkins  calculation, which is a validated novel method providing  better accuracy than the Friedewald equation in the  estimation of LDL-C.  Horald Pollen et al. Lenox Ahr. 4098;119(14): 2061-2068  (http://education.[facility].com/faq/FAQ164)     TARGET DATE: The 25th  Assessment: Jesusita Oka presents today for his maintenance Cabenuva injections. Past injections were tolerated well without issues. Of note, he is one day  early for his injection window but is going out of town for 2 weeks so will proceed with giving the injections today. Last HIV RNA was undetectable back in May. Will defer today.  Administered cabotegravir 600mg /9mL in left upper outer quadrant of the gluteal muscle. Administered rilpivirine 900 mg/46mL in the right upper outer quadrant of the gluteal muscle. No issues with injections. He will follow up in 2 months for next set of injections.  Plan: - Cabenuva injections administered - Next injections scheduled for 02/14/23 with Dr. Luciana Axe and 04/17/23 with me - Call with any issues or questions  Rodrigus Kilker L. Cierrah Dace, PharmD, BCIDP, AAHIVP, CPP Clinical Pharmacist Practitioner Infectious Diseases Clinical Pharmacist [facility]

## 2022-12-13 ENCOUNTER — Encounter: Payer: Commercial Managed Care - PPO | Admitting: Pharmacist

## 2022-12-18 ENCOUNTER — Other Ambulatory Visit (HOSPITAL_COMMUNITY): Payer: Self-pay

## 2023-01-21 ENCOUNTER — Other Ambulatory Visit (HOSPITAL_COMMUNITY): Payer: Self-pay

## 2023-01-28 ENCOUNTER — Encounter: Payer: Self-pay | Admitting: Pharmacist

## 2023-02-01 ENCOUNTER — Telehealth: Payer: Self-pay

## 2023-02-01 NOTE — Telephone Encounter (Signed)
[facility] Patient Advocate Encounter  Patient's medication Kevin Burke) have been couriered to [facility] from [facility] and will be administered on the patient next office visit on 02/14/23.  Clearance Coots , CPhT Specialty Pharmacy Patient [facility] for Infectious Disease Phone: [phone] Fax:  [phone]

## 2023-02-14 ENCOUNTER — Other Ambulatory Visit: Payer: Self-pay

## 2023-02-14 ENCOUNTER — Encounter: Payer: Self-pay | Admitting: Internal Medicine

## 2023-02-14 ENCOUNTER — Ambulatory Visit: Payer: Commercial Managed Care - PPO | Admitting: Internal Medicine

## 2023-02-14 VITALS — BP 137/87 | HR 90 | Temp 98.0°F | Ht 66.0 in | Wt 166.0 lb

## 2023-02-14 DIAGNOSIS — B20 Human immunodeficiency virus [HIV] disease: Secondary | ICD-10-CM

## 2023-02-14 DIAGNOSIS — F17210 Nicotine dependence, cigarettes, uncomplicated: Secondary | ICD-10-CM

## 2023-02-14 DIAGNOSIS — Z720 Tobacco use: Secondary | ICD-10-CM

## 2023-02-14 DIAGNOSIS — Z23 Encounter for immunization: Secondary | ICD-10-CM

## 2023-02-14 HISTORY — DX: Encounter for immunization: Z23

## 2023-02-14 MED ORDER — CABOTEGRAVIR & RILPIVIRINE ER 600 & 900 MG/3ML IM SUER
1.0000 | Freq: Once | INTRAMUSCULAR | Status: AC
  Administered 2023-02-14: 1 via INTRAMUSCULAR

## 2023-02-14 NOTE — Progress Notes (Signed)
Subjective:    Patient ID: [name], male    DOB: 1970-09-22, 52 y.o.   MRN: [id number]  HPI Jesusita Oka is here for follow up of HIV He continues on [place] and has had no issues with it.  No complaints.  Not currently sexually active.  He conitnues losing weight with Ozempic and healthier choices.     Review of Systems  Constitutional:  Negative for fatigue.  Gastrointestinal:  Negative for diarrhea and nausea.  Skin:  Negative for rash.       Objective:   Physical Exam Eyes:     General: No scleral icterus. Pulmonary:     Effort: Pulmonary effort is normal.  Skin:    Findings: No rash.  Neurological:     Mental Status: He is alert.   SH: + tobacco        Assessment & Plan:

## 2023-02-14 NOTE — Assessment & Plan Note (Signed)
Flu shot discussed and given today Also given COVID

## 2023-02-14 NOTE — Assessment & Plan Note (Signed)
Discussed cessation

## 2023-02-14 NOTE — Assessment & Plan Note (Signed)
He continues to do well, no concerns.   Cab today and has follow up in 2 months Discussed anal pap and he has deferred for now.

## 2023-02-14 NOTE — Addendum Note (Signed)
Addended by: Philippa Chester on: 02/14/2023 09:39 AM   Modules accepted: Orders

## 2023-02-15 LAB — T-HELPER CELL (CD4) - (RCID CLINIC ONLY)
CD4 % Helper T Cell: 9 % — ABNORMAL LOW (ref 33–65)
CD4 T Cell Abs: 163 /uL — ABNORMAL LOW (ref 400–1790)

## 2023-02-16 LAB — CBC WITH DIFFERENTIAL/PLATELET
Absolute Monocytes: 369 cells/uL (ref 200–950)
Basophils Absolute: 21 cells/uL (ref 0–200)
Basophils Relative: 0.4 %
Eosinophils Absolute: 99 cells/uL (ref 15–500)
Eosinophils Relative: 1.9 %
HCT: 44.3 % (ref 38.5–50.0)
Hemoglobin: 14.1 g/dL (ref 13.2–17.1)
Lymphs Abs: 1862 cells/uL (ref 850–3900)
MCH: 25.7 pg — ABNORMAL LOW (ref 27.0–33.0)
MCHC: 31.8 g/dL — ABNORMAL LOW (ref 32.0–36.0)
MCV: 80.7 fL (ref 80.0–100.0)
MPV: 10.5 fL (ref 7.5–12.5)
Monocytes Relative: 7.1 %
Neutro Abs: 2850 cells/uL (ref 1500–7800)
Neutrophils Relative %: 54.8 %
Platelets: 203 10*3/uL (ref 140–400)
RBC: 5.49 10*6/uL (ref 4.20–5.80)
RDW: 15 % (ref 11.0–15.0)
Total Lymphocyte: 35.8 %
WBC: 5.2 10*3/uL (ref 3.8–10.8)

## 2023-02-16 LAB — HIV-1 RNA QUANT-NO REFLEX-BLD
HIV 1 RNA Quant: NOT DETECTED Copies/mL
HIV-1 RNA Quant, Log: NOT DETECTED Log cps/mL

## 2023-02-16 LAB — COMPLETE METABOLIC PANEL WITH GFR
AG Ratio: 1.1 (calc) (ref 1.0–2.5)
ALT: 12 U/L (ref 9–46)
AST: 17 U/L (ref 10–35)
Albumin: 3.7 g/dL (ref 3.6–5.1)
Alkaline phosphatase (APISO): 81 U/L (ref 35–144)
BUN/Creatinine Ratio: 12 (calc) (ref 6–22)
BUN: 16 mg/dL (ref 7–25)
CO2: 27 mmol/L (ref 20–32)
Calcium: 9.1 mg/dL (ref 8.6–10.3)
Chloride: 102 mmol/L (ref 98–110)
Creat: 1.32 mg/dL — ABNORMAL HIGH (ref 0.70–1.30)
Globulin: 3.5 g/dL (calc) (ref 1.9–3.7)
Glucose, Bld: 126 mg/dL — ABNORMAL HIGH (ref 65–99)
Potassium: 3.9 mmol/L (ref 3.5–5.3)
Sodium: 137 mmol/L (ref 135–146)
Total Bilirubin: 0.8 mg/dL (ref 0.2–1.2)
Total Protein: 7.2 g/dL (ref 6.1–8.1)
eGFR: 65 mL/min/{1.73_m2} (ref >=60)

## 2023-03-28 ENCOUNTER — Other Ambulatory Visit (HOSPITAL_COMMUNITY): Payer: Self-pay

## 2023-04-01 ENCOUNTER — Other Ambulatory Visit (HOSPITAL_COMMUNITY): Payer: Self-pay

## 2023-04-01 ENCOUNTER — Other Ambulatory Visit: Payer: Self-pay

## 2023-04-01 NOTE — Progress Notes (Signed)
Specialty Pharmacy Refill Coordination Note  Kevin Burke is a 52 y.o. male assessed today regarding refills of clinic administered specialty medication(s) Cabotegravir & Rilpivirine   Clinic requested Courier to Provider Office   Delivery date: 04/11/23   Verified address: [facility] [address] [place] [address]   Medication will be filled on 04/10/23.

## 2023-04-11 ENCOUNTER — Telehealth: Payer: Self-pay

## 2023-04-11 NOTE — Telephone Encounter (Signed)
[facility] Patient Advocate Encounter  Patient's medications CABENUVA have been couriered to [facility] from [facility] and will be administered at the patients appointment on 04/17/23.  Kae Heller, CPhT Specialty Pharmacy Patient [facility] for Infectious Disease Phone: [phone] Fax:  [phone]

## 2023-04-16 NOTE — Progress Notes (Unsigned)
HPI: Kevin Burke is a 52 y.o. male who presents to the [facility] pharmacy clinic for [place] administration.  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   CABOTEGRAVIR & RILPIVIRINE ER (CABENUVA) 600 & 900 MG/3ML INJECTION    Inject 1 kit into the muscle every 2 (two) months.   CARVEDILOL (COREG) 25 MG TABLET    Take 1 tablet (25 mg total) by mouth 2 (two) times daily with a meal.   FENOFIBRATE 160 MG TABLET    Take 1 tablet (160 mg total) by mouth daily.   FLUTICASONE (FLONASE) 50 MCG/ACT NASAL SPRAY    Place into both nostrils.   FUROSEMIDE (LASIX) 40 MG TABLET    Take 1 tablet by mouth daily as needed.   GLIPIZIDE (GLUCOTROL) 5 MG TABLET    Take 1 tablet (5 mg total) by mouth daily before breakfast.   GLUCOSE BLOOD (ONETOUCH VERIO) TEST STRIP    as directed In Vitro as directed for 90 days   LANCETS MISC. (PENLET II REPLACEMENT CAP) MISC    1 each by Does not apply route daily.   LISINOPRIL (ZESTRIL) 40 MG TABLET    Take 1 tablet (40 mg total) by mouth daily.   OMEGA 3 1000 MG CAPS    1 capsule   OZEMPIC, 0.25 OR 0.5 MG/DOSE, 2 MG/3ML SOPN    Inject into the skin.   TESTOSTERONE 20.25 MG/ACT (1.62%) GEL    1 pump to skin in the morning to shoulder, upper arms or abdomen   TOUJEO  SOLOSTAR 300 UNIT/ML SOLOSTAR PEN    take 10 units in the morning Subcutaneous once daily  Modified Medications   No medications on file  Discontinued Medications   No medications on file    Allergies: Allergies  Allergen Reactions   Bactrim Ds [Sulfamethoxazole-Trimethoprim] Hives    Hives/ itching     Labs: Lab Results  Component Value Date   HIV1RNAQUANT Not Detected 02/14/2023   HIV1RNAQUANT <20 (H) 10/24/2022   HIV1RNAQUANT <20 (H) 06/14/2022   CD4TABS 163 (L) 02/14/2023   CD4TABS 234 (L) 10/24/2022   CD4TABS 255 (L) 08/16/2022    RPR and STI Lab Results  Component Value Date   LABRPR NON-REACTIVE 07/12/2021   LABRPR CANCELED 07/27/2020    STI Results GC CT  07/27/2020  4:08 PM Negative  Negative     Hepatitis B Lab Results  Component Value Date   HEPBSAB NON-REACTIVE 04/16/2022   HEPBSAG NON-REACTIVE 07/27/2020   HEPBCAB NON-REACTIVE 07/27/2020   Hepatitis C Lab Results  Component Value Date   HEPCAB NON-REACTIVE 07/27/2020   Hepatitis A Lab Results  Component Value Date   HAV REACTIVE (A) 04/16/2022   Lipids: Lab Results  Component Value Date   CHOL 319 (H) 04/16/2022  TRIG 2,552 (H) 04/16/2022   HDL 30 (L) 04/16/2022   CHOLHDL 10.6 (H) 04/16/2022   LDLCALC  04/16/2022     Comment:     . LDL cholesterol not calculated. Triglyceride levels greater than 400 mg/dL invalidate calculated LDL results. . Reference range: <100 . Desirable range <100 mg/dL for primary prevention;   <70 mg/dL for patients with CHD or diabetic patients  with > or = 2 CHD risk factors. Marland Kitchen LDL-C is now calculated using the Martin-Hopkins  calculation, which is a validated novel method providing  better accuracy than the Friedewald equation in the  estimation of LDL-C.  Horald Pollen et al. Lenox Ahr. 1610;960(45): 2061-2068  (http://education.[facility].com/faq/FAQ164)     TARGET DATE: The 25th of the month  Assessment: Kevin Burke presents today for his maintenance  Cabenuva injections. Past injections were tolerated well without issues. Last provider visit was with Dr. Luciana Axe on 02/14/23, with undetectable viral load and CD4 count of 194 (9%) at that time. Note bactrim allergy, unspecified. Received a course of bactrim in February. Patient reports hives on his upper body after initial dose. Due for next provider visit in March 2025. He declines STI testing today. Given that his viral load is greater than 100, and he has a suppressed viral load, it is appropriate to continue without PJP prophylaxis per NIH guidelines.  Immunizations: Consider Heplisav-B revaccination (Ab negative), and due for Shingrix today. Kevin Burke declines these today.  Administered cabotegravir 600mg /57mL in left upper outer quadrant of the gluteal muscle. Administered rilpivirine 900 mg/45mL in the right upper outer quadrant of the gluteal muscle. No issues with injections. Kevin Burke will follow up in 2 months for next set of injections.  Plan: - Cabenuva injections administered - Next injections scheduled for 06/17/23 with Cassie and 08/21/23 with Dr. Luciana Axe - Call with any issues or questions  Lora Paula, PharmD PGY-2 Infectious Diseases Pharmacy Resident [facility] 04/17/2023 8:59 AM

## 2023-04-17 ENCOUNTER — Other Ambulatory Visit: Payer: Self-pay

## 2023-04-17 ENCOUNTER — Ambulatory Visit (INDEPENDENT_AMBULATORY_CARE_PROVIDER_SITE_OTHER): Payer: Commercial Managed Care - PPO | Admitting: Pharmacist

## 2023-04-17 DIAGNOSIS — B20 Human immunodeficiency virus [HIV] disease: Secondary | ICD-10-CM | POA: Diagnosis not present

## 2023-04-17 MED ORDER — CABOTEGRAVIR & RILPIVIRINE ER 600 & 900 MG/3ML IM SUER
1.0000 | Freq: Once | INTRAMUSCULAR | Status: AC
  Administered 2023-04-17: 1 via INTRAMUSCULAR

## 2023-05-13 ENCOUNTER — Encounter: Payer: Self-pay | Admitting: Pharmacist

## 2023-05-20 NOTE — Telephone Encounter (Signed)
Do you feel like you can provide a written excuse?

## 2023-05-23 ENCOUNTER — Telehealth: Payer: Self-pay

## 2023-05-23 NOTE — Telephone Encounter (Signed)
Patient called office requesting letter for employer excusing him from calling out on 12/2. States that two weeks prior he had appt for cab and felt more pain on right side than usual. Employer is meeting with patient today regarding call out and will need to have excuse note for meeting.  Would prefer we do not disclose HIV status or have office name on letter for privacy reasons. Secure chat forwarded to Dr. Luciana Axe to advise on letter. Will also forward to Cassie, Pharmacist who saw patient that day.  [name], RMA

## 2023-05-27 ENCOUNTER — Other Ambulatory Visit: Payer: Self-pay

## 2023-05-27 ENCOUNTER — Other Ambulatory Visit (HOSPITAL_COMMUNITY): Payer: Self-pay

## 2023-05-27 NOTE — Progress Notes (Signed)
Specialty Pharmacy Refill Coordination Note  Kevin Burke is a 52 y.o. male assessed today regarding refills of clinic administered specialty medication(s) Cabotegravir & Rilpivirine [facility])   Clinic requested Courier to Provider Office   Delivery date: 05/30/23   Verified address: [address] [place] [address]   Medication will be filled on 05/28/23.

## 2023-05-28 ENCOUNTER — Other Ambulatory Visit: Payer: Self-pay

## 2023-05-30 ENCOUNTER — Telehealth: Payer: Self-pay

## 2023-05-30 NOTE — Telephone Encounter (Signed)
 [facility] Patient Advocate Encounter  Patient's medications CABENUVA  have been couriered to [facility] from [facility] and will be administered at the patients appointment on 06/17/23.  Verline Glow, CPhT Specialty Pharmacy Patient [facility] Phone: [phone] Fax:  [phone]

## 2023-06-09 IMAGING — CT CT ANGIO HEAD
3 of 11 series · 15 of 47 positions shown · IV contrast (Omnipaque or Isovue)
Comparison: None available.

CLINICAL DATA: Initial evaluation for acute headache.  She/

EXAM:
CT ANGIOGRAPHY HEAD
TECHNIQUE: Multidetector CT imaging of the head was performed using the
standard protocol during bolus administration of intravenous
contrast. Multiplanar CT image reconstructions and MIPs were
obtained to evaluate the vascular anatomy.

[Series 10: ax thin · axial · 0.39mm/px · z∈[-20,+111]mm · 9 of 157 slices shown]
[im 13/157  brain]
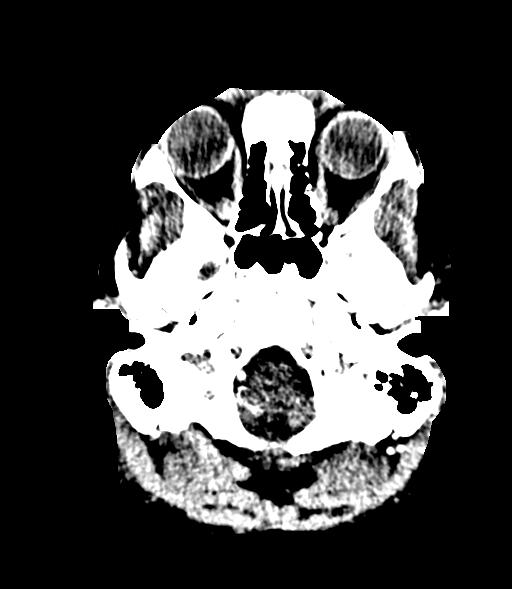
[im 37/157  bone]
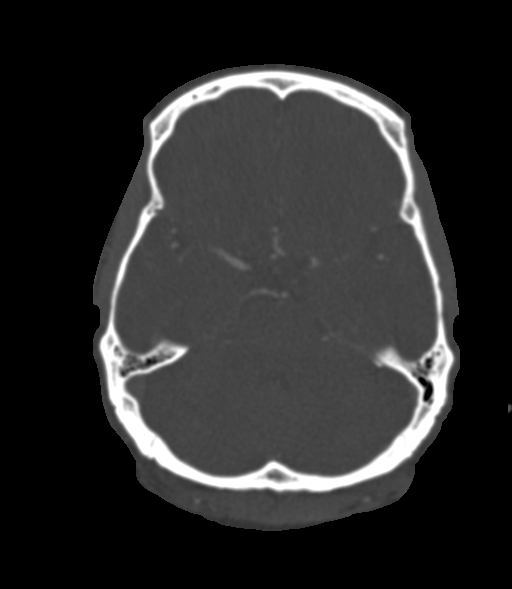
[im 49/157  brain]
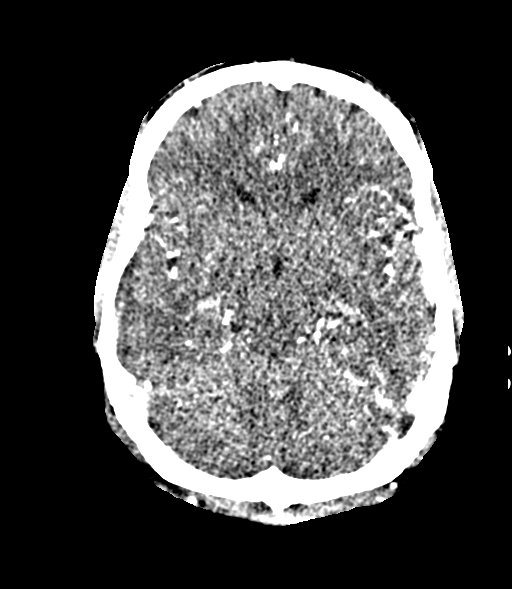
[im 61/157  bone]
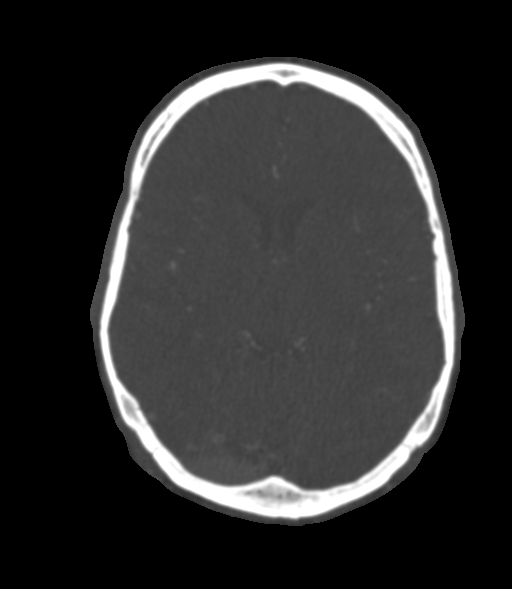
[im 85/157  brain]
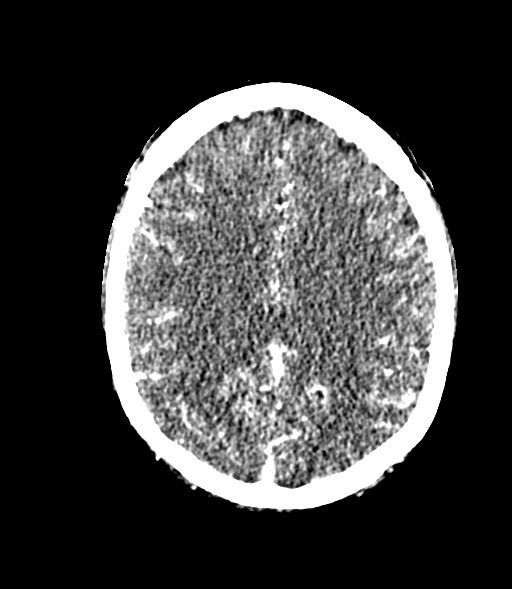
[im 97/157  bone]
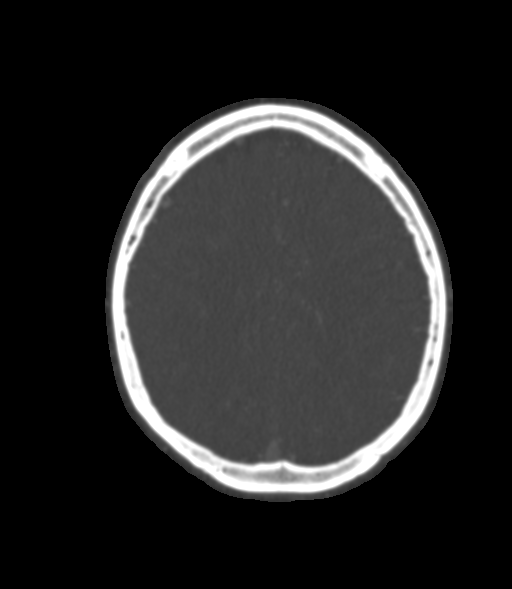
[im 109/157  brain]
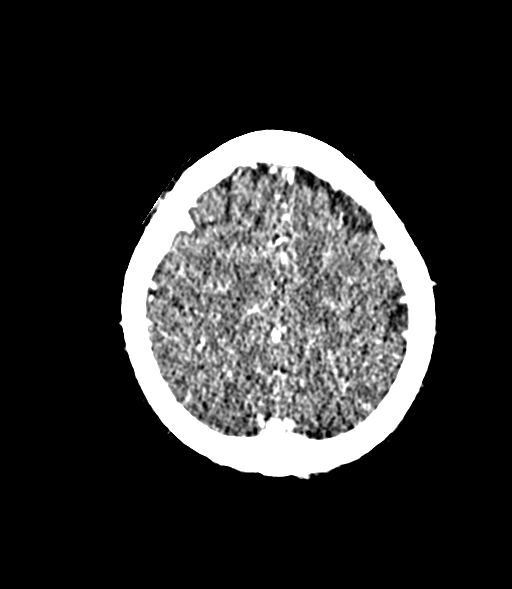
[im 133/157  bone]
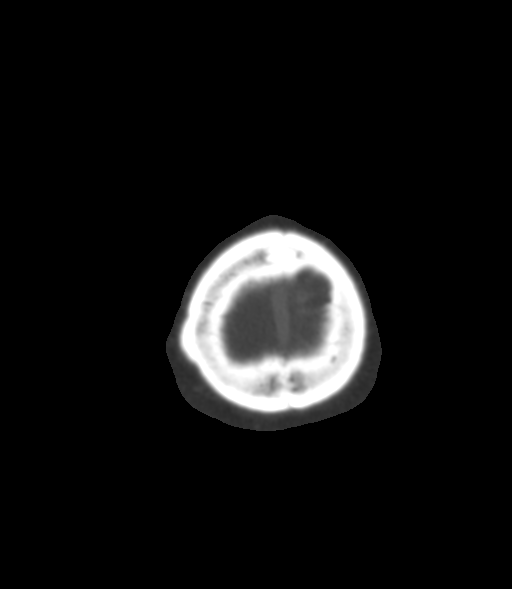
[im 145/157  brain]
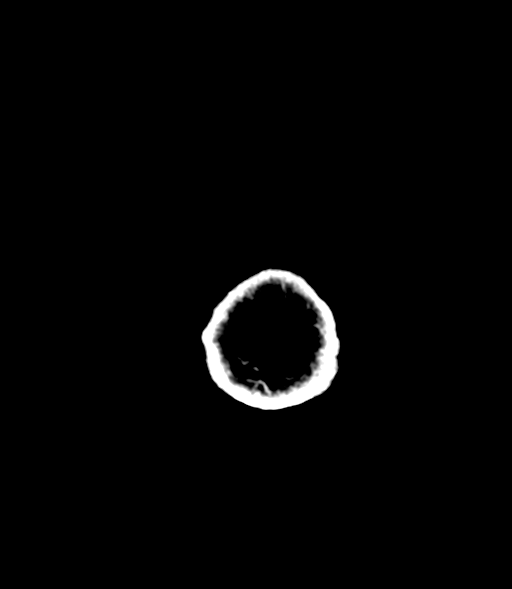

[Series 12: cor thin · coronal · 0.35mm/px · 3 of 226 slices shown]
[im 76/226  brain]
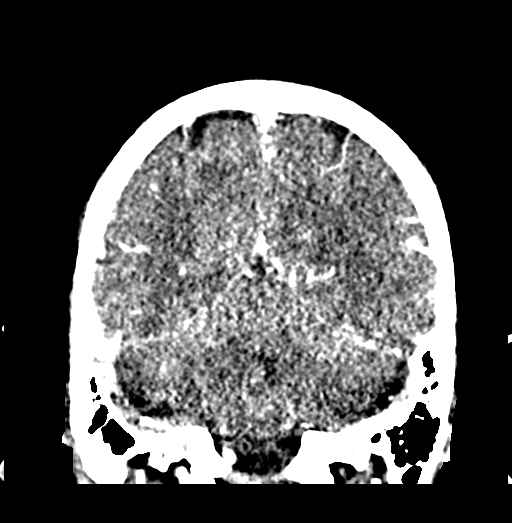
[im 113/226  brain]
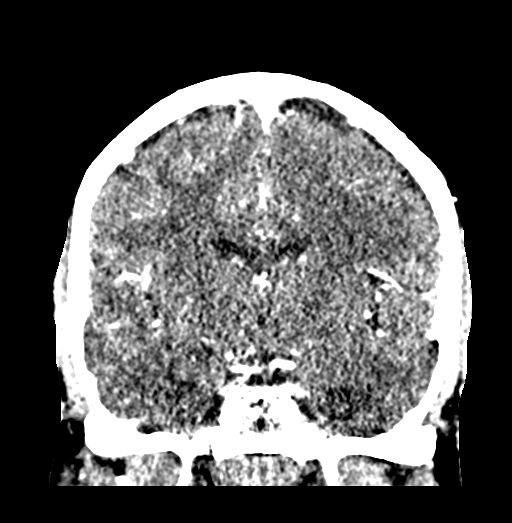
[im 151/226  brain]
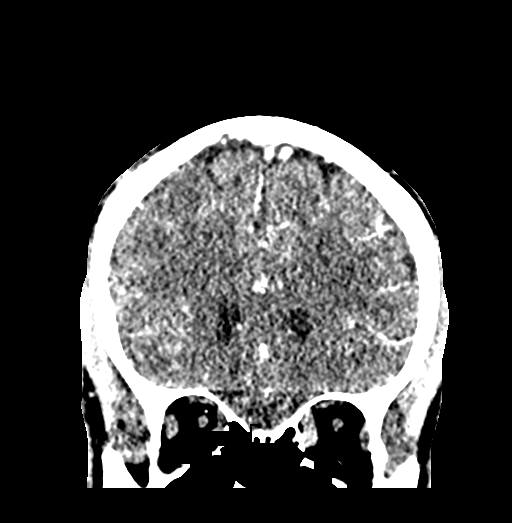

[Series 14: sag thin · sagittal · 0.32mm/px · 3 of 191 slices shown]
[im 48/191  brain]
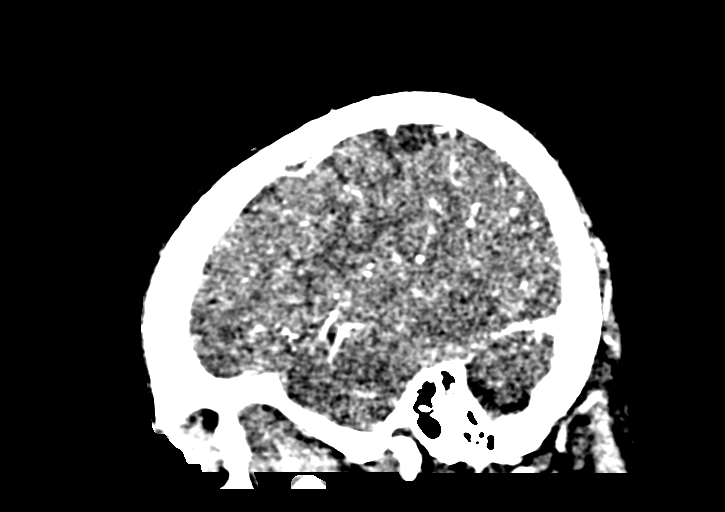
[im 96/191  brain]
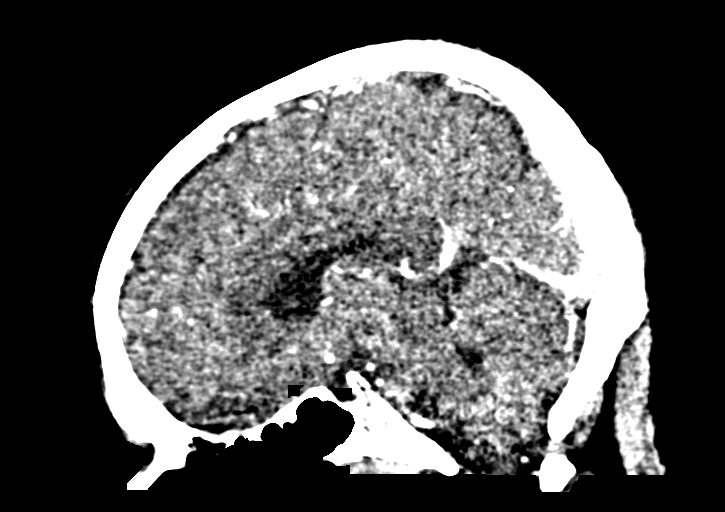
[im 143/191  brain]
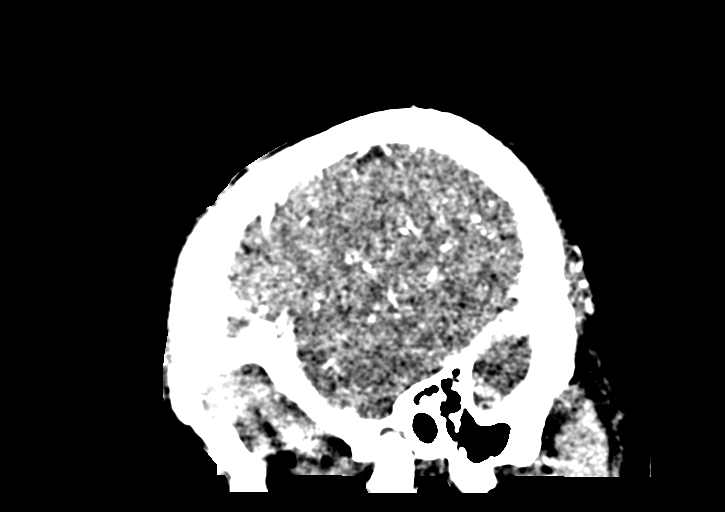

[15 of 47 positions shown; findings below may reference images not displayed]

RADIATION DOSE REDUCTION: This exam was performed according to the
departmental dose-optimization program which includes automated
exposure control, adjustment of the mA and/or kV according to
patient size and/or use of iterative reconstruction technique.

CONTRAST:  75mL OMNIPAQUE IOHEXOL 350 MG/ML SOLN
FINDINGS: CT HEAD

Brain: Cerebral volume within normal limits for patient age.

No evidence for acute intracranial hemorrhage. No findings to
suggest acute large vessel territory infarct. No mass lesion,
midline shift, or mass effect. Ventricles are normal in size without
evidence for hydrocephalus. No extra-axial fluid collection
identified.

Vascular: No hyperdense vessel identified.

Skull: Scalp soft tissues demonstrate no acute abnormality.
Calvarium intact.

Sinuses/Orbits: Globes and orbital soft tissues within normal
limits.

Visualized paranasal sinuses are clear. No mastoid effusion.

CTA HEAD

Anterior circulation: Distal cervical segments of the internal
carotid arteries are widely patent bilaterally. Petrous segments
widely patent. Atheromatous irregularity within the right carotid
siphon without significant stenosis. Atheromatous change within the
left carotid siphon with associated tandem severe segmental stenoses
(series 10, image 28). A1 segments patent. Normal anterior
communicating artery complex. Anterior cerebral arteries widely
patent without stenosis. Normal in stenosis or occlusion. Normal MCA
bifurcations. Distal MCA branches perfused and symmetric.

Posterior circulation: Right vertebral artery dominant and patent to
the vertebrobasilar junction without stenosis. Right PICA patent.
Scant flow noted within the visualized left V3 segment. Left
vertebral artery occluded as it courses into the cranial vault.
Retrograde filling of the distal left V4 segment across the
vertebrobasilar junction with perfusion of the left PICA. Basilar
patent without stenosis. Superior cerebellar arteries patent
bilaterally. Both PCAs primarily supplied via the basilar well
perfused or distal aspects. Small right posterior paired artery
noted.

Venous sinuses: Grossly patent allowing for timing the contrast
bolus.

Anatomic variants: As above. No intracranial aneurysm or other
vascular malformation.

Review of the MIP images confirms the above findings.
IMPRESSION: 1. No acute intracranial abnormality.
2. Occlusion of the left vertebral artery as it courses into the
cranial vault, with retrograde filling of the distal left V4 segment
across the vertebrobasilar junction.
3. Atheromatous change within the left carotid siphon with
associated tandem severe segmental stenoses.
4. Otherwise wide patency of the major arterial vasculature of the
head. No other large vessel occlusion or other emergent finding. No
intracranial aneurysm.

## 2023-06-16 NOTE — Progress Notes (Unsigned)
HPI: Kevin Burke is a 53 y.o. male who presents to the [facility] pharmacy clinic for [place] administration.  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   CABOTEGRAVIR & RILPIVIRINE ER (CABENUVA) 600 & 900 MG/3ML INJECTION    Inject 1 kit into the muscle every 2 (two) months.   CARVEDILOL (COREG) 25 MG TABLET    Take 1 tablet (25 mg total) by mouth 2 (two) times daily with a meal.   FENOFIBRATE 160 MG TABLET    Take 1 tablet (160 mg total) by mouth daily.   FLUTICASONE (FLONASE) 50 MCG/ACT NASAL SPRAY    Place into both nostrils.   FUROSEMIDE (LASIX) 40 MG TABLET    Take 1 tablet by mouth daily as needed.   GLIPIZIDE (GLUCOTROL) 5 MG TABLET    Take 1 tablet (5 mg total) by mouth daily before breakfast.   GLUCOSE BLOOD (ONETOUCH VERIO) TEST STRIP    as directed In Vitro as directed for 90 days   LANCETS MISC. (PENLET II REPLACEMENT CAP) MISC    1 each by Does not apply route daily.   LISINOPRIL (ZESTRIL) 40 MG TABLET    Take 1 tablet (40 mg total) by mouth daily.   OMEGA 3 1000 MG CAPS    1 capsule   OZEMPIC, 0.25 OR 0.5 MG/DOSE, 2 MG/3ML SOPN    Inject into the skin.   TESTOSTERONE 20.25 MG/ACT (1.62%) GEL    1 pump to skin in the morning to shoulder, upper arms or abdomen   TOUJEO  SOLOSTAR 300 UNIT/ML SOLOSTAR PEN    take 10 units in the morning Subcutaneous once daily  Modified Medications   No medications on file  Discontinued Medications   No medications on file    Allergies: Allergies  Allergen Reactions   Bactrim Ds [Sulfamethoxazole-Trimethoprim] Hives    Hives/ itching     Labs: Lab Results  Component Value Date   HIV1RNAQUANT Not Detected 02/14/2023   HIV1RNAQUANT <20 (H) 10/24/2022   HIV1RNAQUANT <20 (H) 06/14/2022   CD4TABS 163 (L) 02/14/2023   CD4TABS 234 (L) 10/24/2022   CD4TABS 255 (L) 08/16/2022    RPR and STI Lab Results  Component Value Date   LABRPR NON-REACTIVE 07/12/2021   LABRPR CANCELED 07/27/2020    STI Results GC CT  07/27/2020  4:08 PM Negative  Negative     Hepatitis B Lab Results  Component Value Date   HEPBSAB NON-REACTIVE 04/16/2022   HEPBSAG NON-REACTIVE 07/27/2020   HEPBCAB NON-REACTIVE 07/27/2020   Hepatitis C Lab Results  Component Value Date   HEPCAB NON-REACTIVE 07/27/2020   Hepatitis A Lab Results  Component Value Date   HAV REACTIVE (A) 04/16/2022   Lipids: Lab Results  Component Value Date   CHOL 319 (H) 04/16/2022  TRIG 2,552 (H) 04/16/2022   HDL 30 (L) 04/16/2022   CHOLHDL 10.6 (H) 04/16/2022   LDLCALC  04/16/2022     Comment:     . LDL cholesterol not calculated. Triglyceride levels greater than 400 mg/dL invalidate calculated LDL results. . Reference range: <100 . Desirable range <100 mg/dL for primary prevention;   <70 mg/dL for patients with CHD or diabetic patients  with > or = 2 CHD risk factors. Marland Kitchen LDL-C is now calculated using the Martin-Hopkins  calculation, which is a validated novel method providing  better accuracy than the Friedewald equation in the  estimation of LDL-C.  Horald Pollen et al. Lenox Ahr. 1610;960(45): 2061-2068  (http://education.[facility].com/faq/FAQ164)     TARGET DATE: 25th day of the month  Assessment: Kevin Burke presents today for their  maintenance Cabenuva injections. Last seen on 04/17/23. Reports mild-moderate injection site pain from most recent injection which he medicated with acetaminophen and resolved after about 2 weeks. Last HIV RNA was 135 and CD4 383 from 06/14/2023. Declined STI testing today.   Vaccination: HepB core Ab and surface Ag negative from 04/16/2022. Last HepB vaccine 2/2 from 07/12/21. Patient declined HepB vaccine from last visit. Patient also declined HZV (Shingrix) vaccine on last visit. Patient declined HepB 1/2 and Shringrix vaccines again today.   Allergies: Bactrim (hives on upper body post initial dose)  Administered cabotegravir 600mg /41mL in left upper outer quadrant of the gluteal muscle. Administered rilpivirine 900 mg/50mL in the right upper outer quadrant of the gluteal muscle. They will follow up in 2 months for next set of injections.  Plan: - Cabenuva injections administered - Deferred HIV RNA, CD4, and Lipid Profile on next follow up visit  - Next injections scheduled for 08/21/2023 with Dr. Luciana Axe - Scheduled with Marchelle Folks on 10/17/2023 for follow up and injections - Call with any issues or questions  Dimple Casey. Edwena Blow, PharmD Candidate [facility] [employer]

## 2023-06-17 ENCOUNTER — Ambulatory Visit (INDEPENDENT_AMBULATORY_CARE_PROVIDER_SITE_OTHER): Payer: Commercial Managed Care - PPO | Admitting: Pharmacist

## 2023-06-17 ENCOUNTER — Other Ambulatory Visit: Payer: Self-pay

## 2023-06-17 DIAGNOSIS — B20 Human immunodeficiency virus [HIV] disease: Secondary | ICD-10-CM | POA: Diagnosis not present

## 2023-06-17 MED ORDER — CABOTEGRAVIR & RILPIVIRINE ER 600 & 900 MG/3ML IM SUER
1.0000 | Freq: Once | INTRAMUSCULAR | Status: AC
  Administered 2023-06-17: 1 via INTRAMUSCULAR

## 2023-07-01 ENCOUNTER — Telehealth: Payer: Self-pay

## 2023-07-01 ENCOUNTER — Other Ambulatory Visit (HOSPITAL_COMMUNITY): Payer: Self-pay

## 2023-07-01 NOTE — Telephone Encounter (Signed)
Pharmacy Patient Advocate Encounter- Cabenuva BIV-Pharmacy Benefit:  PA was submitted to [employer] and has been approved through: 07/01/23-06/30/24 Authorization#  [id number]  Please send prescription to Specialty Pharmacy: [facility]: [phone]  Estimated Copay is: 0.00

## 2023-07-25 ENCOUNTER — Other Ambulatory Visit: Payer: Self-pay

## 2023-07-30 ENCOUNTER — Other Ambulatory Visit (HOSPITAL_COMMUNITY): Payer: Self-pay

## 2023-07-30 ENCOUNTER — Other Ambulatory Visit: Payer: Self-pay | Admitting: Pharmacist

## 2023-07-30 ENCOUNTER — Other Ambulatory Visit: Payer: Self-pay

## 2023-07-30 DIAGNOSIS — B20 Human immunodeficiency virus [HIV] disease: Secondary | ICD-10-CM

## 2023-07-30 MED ORDER — CABOTEGRAVIR & RILPIVIRINE ER 600 & 900 MG/3ML IM SUER
1.0000 | INTRAMUSCULAR | 5 refills | Status: AC
  Filled 2023-07-30: qty 6, 30d supply, fill #0
  Filled 2023-09-30: qty 6, 30d supply, fill #1
  Filled 2023-12-09: qty 6, 30d supply, fill #2
  Filled 2024-02-10: qty 6, 30d supply, fill #3
  Filled 2024-04-08: qty 6, 30d supply, fill #4
  Filled 2024-06-08: qty 6, 30d supply, fill #5

## 2023-07-30 NOTE — Progress Notes (Signed)
 Specialty Pharmacy Refill Coordination Note  Kevin Burke is a 53 y.o. male assessed today regarding refills of clinic administered specialty medication(s) Cabotegravir & Rilpivirine [facility])   Clinic requested Courier to Provider Office   Delivery date: 08/15/23   Verified address: [address] [place] [address]   Medication will be filled on 08/14/23.

## 2023-08-01 ENCOUNTER — Other Ambulatory Visit: Payer: Self-pay

## 2023-08-01 DIAGNOSIS — E782 Mixed hyperlipidemia: Secondary | ICD-10-CM | POA: Insufficient documentation

## 2023-08-01 DIAGNOSIS — E119 Type 2 diabetes mellitus without complications: Secondary | ICD-10-CM | POA: Insufficient documentation

## 2023-08-01 DIAGNOSIS — E559 Vitamin D deficiency, unspecified: Secondary | ICD-10-CM | POA: Insufficient documentation

## 2023-08-01 DIAGNOSIS — E291 Testicular hypofunction: Secondary | ICD-10-CM | POA: Insufficient documentation

## 2023-08-01 DIAGNOSIS — I509 Heart failure, unspecified: Secondary | ICD-10-CM | POA: Insufficient documentation

## 2023-08-01 DIAGNOSIS — G479 Sleep disorder, unspecified: Secondary | ICD-10-CM | POA: Insufficient documentation

## 2023-08-01 DIAGNOSIS — I6502 Occlusion and stenosis of left vertebral artery: Secondary | ICD-10-CM | POA: Insufficient documentation

## 2023-08-02 ENCOUNTER — Encounter: Payer: Self-pay | Admitting: Cardiology

## 2023-08-02 ENCOUNTER — Encounter (HOSPITAL_COMMUNITY): Payer: Self-pay

## 2023-08-02 ENCOUNTER — Ambulatory Visit: Attending: Cardiology | Admitting: Cardiology

## 2023-08-02 VITALS — BP 140/92 | HR 90 | Ht 66.0 in | Wt 164.6 lb

## 2023-08-02 DIAGNOSIS — B20 Human immunodeficiency virus [HIV] disease: Secondary | ICD-10-CM | POA: Diagnosis not present

## 2023-08-02 DIAGNOSIS — I10 Essential (primary) hypertension: Secondary | ICD-10-CM

## 2023-08-02 DIAGNOSIS — Z720 Tobacco use: Secondary | ICD-10-CM

## 2023-08-02 DIAGNOSIS — E782 Mixed hyperlipidemia: Secondary | ICD-10-CM | POA: Diagnosis not present

## 2023-08-02 DIAGNOSIS — F17210 Nicotine dependence, cigarettes, uncomplicated: Secondary | ICD-10-CM

## 2023-08-02 DIAGNOSIS — I5020 Unspecified systolic (congestive) heart failure: Secondary | ICD-10-CM

## 2023-08-02 NOTE — Progress Notes (Addendum)
 Cardiology Office Note:    Date:  08/02/2023   ID:  Kevin Burke, DOB 06-20-1970, MRN [id number]  PCP:  Emilio Aspen, MD  Cardiologist:  Garwin Brothers, MD   Referring MD: Emilio Aspen, *    ASSESSMENT:     1. Mixed hyperlipidemia   2. Systolic congestive heart failure, unspecified HF chronicity (HCC)   3. Primary hypertension   4. Human immunodeficiency virus (HIV) disease (HCC)   5. Tobacco abuse    PLAN:    In order of problems listed above:  Primary prevention stressed with the patient.  Importance of compliance with diet medication stressed and patient verbalized standing. He was advised to continue walking on a regular basis. Cardiomyopathy: This is new.  I will do an exercise stress Cardiolite to assess this.  I would prefer not to give him radiocontrast dye for other evaluations because of renal insufficiency issues.  He agrees. HFrEF: He is on guideline directed therapy.  He is not on Entresto because of renal insufficiency.  Renal insufficiency will have to be monitored and he may be a candidate in the future. Mixed dyslipidemia and hypertriglyceridemia:: Diet was emphasized.  He was advised to take fish oil 2 g twice daily and this will be monitored by his primary care. He was given an appointment to have an echo and 2 to 3 months this will be followed by his appointment in [place] by our practice. Cigarette smoker: I spent 5 minutes with the patient discussing solely about smoking. Smoking cessation was counseled. I suggested to the patient also different medications and pharmacological interventions. Patient is keen to try stopping on its own at this time. He will get back to me if he needs any further assistance in this matter. Patient had multiple questions which were answered to satisfaction.   Medication Adjustments/Labs and Tests Ordered: Current medicines are reviewed at length with the patient today.  Concerns regarding medicines are  outlined above.  Orders Placed This Encounter  Procedures   EKG 12-Lead   No orders of the defined types were placed in this encounter.    History of Present Illness:    Kevin Burke is a 53 y.o. male who is being seen today for the evaluation of congestive heart failure at the request of Emilio Aspen, *.  Patient is a pleasant 53 year old male.  He lives in [place].  He has history of hypertension hypertriglyceridemia, HIV positivity and unfortunately is a smoker.  He has history of diabetes mellitus.  He was recently admitted to the hospital with congestive heart failure and treated ejection fraction 30 to 35%.  He has renal insufficiency.  He mentions to me that he feels much better now he walks on a regular basis now.  He has no dyspnea or any such symptoms.  He tells me that he has not gained weight.  At the time of my evaluation, the patient is alert awake oriented and in no distress.  Past Medical History:  Diagnosis Date   CHF (congestive heart failure) (HCC)    COVID-19 virus infection 02/12/2022   HTN (hypertension) 08/17/2020   Human immunodeficiency virus (HIV) disease (HCC) 07/29/2020   Hypertriglyceridemia 08/18/2020   Hypogonadism in male    Mixed hyperlipidemia    Need for prophylactic vaccination and inoculation against influenza 02/14/2023   Occlusion of left vertebral artery    Routine screening for STI (sexually transmitted infection) 07/12/2021   Sleep disorder    Tobacco abuse 09/13/2021  Type 2 diabetes mellitus (HCC)    Vitamin D deficiency     Past Surgical History:  Procedure Laterality Date   CHOLECYSTECTOMY     HERNIA REPAIR  2018    Current Medications: Current Meds  Medication Sig   cabotegravir & rilpivirine ER (CABENUVA) 600 & 900 MG/3ML injection Inject 1 kit into the muscle every 2 (two) months.   carvedilol (COREG) 25 MG tablet Take 1 tablet (25 mg total) by mouth 2 (two) times daily with a meal.   eplerenone (INSPRA) 25 MG  tablet Take 25 mg by mouth daily.   fluticasone (FLONASE) 50 MCG/ACT nasal spray Place into both nostrils.   furosemide (LASIX) 40 MG tablet Take 1 tablet by mouth daily as needed for edema or fluid.   glipiZIDE (GLUCOTROL) 10 MG tablet Take 10 mg by mouth daily before breakfast.   JARDIANCE 10 MG TABS tablet Take 10 mg by mouth daily.   lisinopril (ZESTRIL) 40 MG tablet Take 1 tablet (40 mg total) by mouth daily.   NIFEdipine (ADALAT CC) 30 MG 24 hr tablet Take 30 mg by mouth daily.   OZEMPIC, 0.25 OR 0.5 MG/DOSE, 2 MG/3ML SOPN Inject 2 mg into the skin once a week.     Allergies:   Bactrim ds [sulfamethoxazole-trimethoprim] and Dapsone   Social History   Socioeconomic History   Marital status: Single    Spouse name: Not on file   Number of children: Not on file   Years of education: Not on file   Highest education level: Not on file  Occupational History   Not on file  Tobacco Use   Smoking status: Every Day    Current packs/day: 0.10    Types: Cigarettes   Smokeless tobacco: Never   Tobacco comments:    Less than 5 per day  Substance and Sexual Activity   Alcohol use: Yes    Comment: seldom   Drug use: Not Currently   Sexual activity: Not Currently    Partners: Male  Other Topics Concern   Not on file  Social History Narrative   Not on file   Social Drivers of Health   Financial Resource Strain: Not on file  Food Insecurity: Low Risk  (06/15/2023)   Received from [facility]   Hunger Vital Sign    Worried About Running Out of Food in the Last Year: Never true    Ran Out of Food in the Last Year: Never true  Transportation Needs: No Transportation Needs (06/15/2023)   Received from [facility]    In the past 12 months, has lack of reliable transportation kept you from medical appointments, meetings, work or from getting things needed for daily living? : No  Physical Activity: Not on file  Stress: Not on file (04/06/2023)  Social Connections:  Not on file     Family History: The patient's family history is not on file. He was adopted.  ROS:   Please see the history of present illness.    All other systems reviewed and are negative.  EKGs/Labs/Other Studies Reviewed:    The following studies were reviewed today:  EKG Interpretation Date/Time:  Friday August 02 2023 13:19:47 EST Ventricular Rate:  90 PR Interval:  156 QRS Duration:  102 QT Interval:  372 QTC Calculation: 455 R Axis:   63  Text Interpretation: Normal sinus rhythm Biatrial enlargement Left ventricular hypertrophy with repolarization abnormality Abnormal ECG When compared with ECG of 18-Apr-2022 09:23, PREVIOUS ECG IS  PRESENT Confirmed by Belva Crome [phone]) on 08/02/2023 1:30:55 PM     Recent Labs: 02/14/2023: ALT 12; BUN 16; Creat 1.32; Hemoglobin 14.1; Platelets 203; Potassium 3.9; Sodium 137  Recent Lipid Panel    Component Value Date/Time   CHOL 319 (H) 04/16/2022 1020   TRIG 2,552 (H) 04/16/2022 1020   HDL 30 (L) 04/16/2022 1020   CHOLHDL 10.6 (H) 04/16/2022 1020   LDLCALC  04/16/2022 1020     Comment:     . LDL cholesterol not calculated. Triglyceride levels greater than 400 mg/dL invalidate calculated LDL results. . Reference range: <100 . Desirable range <100 mg/dL for primary prevention;   <70 mg/dL for patients with CHD or diabetic patients  with > or = 2 CHD risk factors. Marland Kitchen LDL-C is now calculated using the Martin-Hopkins  calculation, which is a validated novel method providing  better accuracy than the Friedewald equation in the  estimation of LDL-C.  Horald Pollen et al. Lenox Ahr. 6045;409(81): 2061-2068  (http://education.[employer].com/faq/FAQ164)     Physical Exam:    VS:  BP (!) 140/92   Pulse 90   Ht 5\' 6"  (1.676 m)   Wt 164 lb 9.6 oz (74.7 kg)   SpO2 99%   BMI 26.57 kg/m     Wt Readings from Last 3 Encounters:  08/02/23 164 lb 9.6 oz (74.7 kg)  02/14/23 166 lb (75.3 kg)  07/31/22 173 lb 3.2 oz (78.6 kg)      GEN: Patient is in no acute distress HEENT: Normal NECK: No JVD; No carotid bruits LYMPHATICS: No lymphadenopathy CARDIAC: S1 S2 regular, 2/6 systolic murmur at the apex. RESPIRATORY:  Clear to auscultation without rales, wheezing or rhonchi  ABDOMEN: Soft, non-tender, non-distended MUSCULOSKELETAL:  No edema; No deformity  SKIN: Warm and dry NEUROLOGIC:  Alert and oriented x 3 PSYCHIATRIC:  Normal affect    Signed, Garwin Brothers, MD  08/02/2023 1:52 PM    [facility]

## 2023-08-02 NOTE — Patient Instructions (Addendum)
 Medication Instructions:  Your physician recommends that you continue on your current medications as directed. Please refer to the Current Medication list given to you today.   *If you need a refill on your cardiac medications before your next appointment, please call your pharmacy*   Lab Work: None ordered If you have labs (blood work) drawn today and your tests are completely normal, you will receive your results only by: MyChart Message (if you have MyChart) OR A paper copy in the mail If you have any lab test that is abnormal or we need to change your treatment, we will call you to review the results.   Testing/Procedures:   [facility]ovascular [facility] at [facility] [address] [place], [place] [address] Phone: [phone]   Please arrive 15 minutes prior to your appointment time for registration and insurance purposes.  The test will take approximately 3 to 4 hours to complete; you may bring reading material.  If someone comes with you to your appointment, they will need to remain in the main lobby due to limited space in the testing area.   How to prepare for your Myocardial Perfusion Test: Do not eat or drink 3 hours prior to your test, except you may have water. Do not consume products containing caffeine (regular or decaffeinated) 12 hours prior to your test. (ex: coffee, chocolate, sodas, tea). Do bring a list of your current medications with you.  If not listed below, you may take your medications as normal. Do not take carvedilol (Coreg) for 24 hours prior to the test.  Bring the medication to your appointment as you may be required to take it once the test is complete. Do wear comfortable clothes (no dresses or overalls) and walking shoes, tennis shoes preferred (No heels or open toe shoes are allowed). Do NOT wear cologne, perfume, aftershave, or lotions (deodorant is allowed). If these instructions are not followed, your test will have to be rescheduled.  If  you cannot keep your appointment, please provide 24 hours notification to the Nuclear Lab, to avoid a possible $50 charge to your account.     Your physician has requested that you have an echocardiogram. Echocardiography is a painless test that uses sound waves to create images of your heart. It provides your doctor with information about the size and shape of your heart and how well your heart's chambers and valves are working. This procedure takes approximately one hour. There are no restrictions for this procedure. Please do NOT wear cologne, perfume, aftershave, or lotions (deodorant is allowed). Please arrive 15 minutes prior to your appointment time.  Please note: We ask at that you not bring children with you during ultrasound (echo/ vascular) testing. Due to room size and safety concerns, children are not allowed in the ultrasound rooms during exams. Our front office staff cannot provide observation of children in our lobby area while testing is being conducted. An adult accompanying a patient to their appointment will only be allowed in the ultrasound room at the discretion of the ultrasound technician under special circumstances. We apologize for any inconvenience.    Follow-Up: At [facility], you and your health needs are our priority.  As part of our continuing mission to provide you with exceptional heart care, we have created designated Provider Care Teams.  These Care Teams include your primary Cardiologist (physician) and Advanced Practice Providers (APPs -  Physician Assistants and Nurse Practitioners) who all work together to provide you with the care you need,  when you need it.  We recommend signing up for the patient portal called "MyChart".  Sign up information is provided on this After Visit Summary.  MyChart is used to connect with patients for Virtual Visits (Telemedicine).  Patients are able to view lab/test results, encounter notes, upcoming appointments, etc.   Non-urgent messages can be sent to your provider as well.   To learn more about what you can do with MyChart, go to [email or web address].    Your next appointment:   4 month(s)  Provider:   You may see Dina Rich, MD or one of the following Advanced Practice Providers on your designated Care Team:   Randall An, PA-C  Jacolyn Reedy, [place]    Other Instructions  Cardiac Nuclear Scan A cardiac nuclear scan is a test that is done to check the flow of blood to your heart. It is done when you are resting and when you are exercising. The test looks for problems such as: Not enough blood reaching a portion of the heart. The heart muscle not working as it should. You may need this test if you have: Heart disease. Lab results that are not normal. Had heart surgery or a balloon procedure to open up blocked arteries (angioplasty) or a small mesh tube (stent). Chest pain. Shortness of breath. Had a heart attack. In this test, a special dye (tracer) is put into your bloodstream. The tracer will travel to your heart. A camera will then take pictures of your heart to see how the tracer moves through your heart. This test is usually done at a hospital and takes 2-4 hours. Tell a doctor about: Any allergies you have. All medicines you are taking, including vitamins, herbs, eye drops, creams, and over-the-counter medicines. Any bleeding problems you have. Any surgeries you have had. Any medical conditions you have. Whether you are pregnant or may be pregnant. Any history of asthma or long-term (chronic) lung disease. Any history of heart rhythm disorders or heart valve conditions. What are the risks? Your doctor will talk with you about risks. These may include: Serious chest pain and heart attack. This is only a risk if the stress portion of the test is done. Fast or uneven heartbeats (palpitations). A feeling of warmth in your chest. This feeling usually does not last  long. Allergic reaction to the tracer. Shortness of breath or trouble breathing. What happens before the test? Ask your doctor about changing or stopping your normal medicines. Follow instructions from your doctor about what you cannot eat or drink. Remove your jewelry on the day of the test. Ask your doctor if you need to avoid nicotine or caffeine. What happens during the test? An IV tube will be inserted into one of your veins. Your doctor will give you a small amount of tracer through the IV tube. You will wait for 20-40 minutes while the tracer moves through your bloodstream. Your heart will be monitored with an electrocardiogram (ECG). You will lie down on an exam table. Pictures of your heart will be taken for about 15-20 minutes. You may also have a stress test. For this test, one of these things may be done: You will be asked to exercise on a treadmill or a stationary bike. You will be given medicines that will make your heart work harder. This is done if you are unable to exercise. When blood flow to your heart has peaked, a tracer will again be given through the IV tube. After 20-40 minutes, you will get  back on the exam table. More pictures will be taken of your heart. Depending on the tracer that is used, more pictures may need to be taken 3-4 hours later. Your IV tube will be removed when the test is over. The test may vary among doctors and hospitals. What happens after the test? Ask your doctor: Whether you can return to your normal schedule, including diet, activities, travel, and medicines. Whether you should drink more fluids. This will help to remove the tracer from your body. Ask your doctor, or the department that is doing the test: When will my results be ready? How will I get my results? What are my treatment options? What other tests do I need? What are my next steps? This information is not intended to replace advice given to you by your health care  provider. Make sure you discuss any questions you have with your health care provider. Document Revised: 10/10/2021 Document Reviewed: 10/10/2021 Elsevier Patient Education  2023 Elsevier Inc.  Echocardiogram An echocardiogram is a test that uses sound waves (ultrasound) to produce images of the heart. Images from an echocardiogram can provide important information about: Heart size and shape. The size and thickness and movement of your heart's walls. Heart muscle function and strength. Heart valve function or if you have stenosis. Stenosis is when the heart valves are too narrow. If blood is flowing backward through the heart valves (regurgitation). A tumor or infectious growth around the heart valves. Areas of heart muscle that are not working well because of poor blood flow or injury from a heart attack. Aneurysm detection. An aneurysm is a weak or damaged part of an artery wall. The wall bulges out from the normal force of blood pumping through the body. Tell a health care provider about: Any allergies you have. All medicines you are taking, including vitamins, herbs, eye drops, creams, and over-the-counter medicines. Any blood disorders you have. Any surgeries you have had. Any medical conditions you have. Whether you are pregnant or may be pregnant. What are the risks? Generally, this is a safe test. However, problems may occur, including an allergic reaction to dye (contrast) that may be used during the test. What happens before the test? No specific preparation is needed. You may eat and drink normally. What happens during the test?  You will take off your clothes from the waist up and put on a hospital gown. Electrodes or electrocardiogram (ECG)patches may be placed on your chest. The electrodes or patches are then connected to a device that monitors your heart rate and rhythm. You will lie down on a table for an ultrasound exam. A gel will be applied to your chest to help  sound waves pass through your skin. A handheld device, called a transducer, will be pressed against your chest and moved over your heart. The transducer produces sound waves that travel to your heart and bounce back (or "echo" back) to the transducer. These sound waves will be captured in real-time and changed into images of your heart that can be viewed on a video monitor. The images will be recorded on a computer and reviewed by your health care provider. You may be asked to change positions or hold your breath for a short time. This makes it easier to get different views or better views of your heart. In some cases, you may receive contrast through an IV in one of your veins. This can improve the quality of the pictures from your heart. The procedure may vary  among health care providers and hospitals. What can I expect after the test? You may return to your normal, everyday life, including diet, activities, and medicines, unless your health care provider tells you not to do that. Follow these instructions at home: It is up to you to get the results of your test. Ask your health care provider, or the department that is doing the test, when your results will be ready. Keep all follow-up visits. This is important. Summary An echocardiogram is a test that uses sound waves (ultrasound) to produce images of the heart. Images from an echocardiogram can provide important information about the size and shape of your heart, heart muscle function, heart valve function, and other possible heart problems. You do not need to do anything to prepare before this test. You may eat and drink normally. After the echocardiogram is completed, you may return to your normal, everyday life, unless your health care provider tells you not to do that. This information is not intended to replace advice given to you by your health care provider. Make sure you discuss any questions you have with your health care  provider. Document Revised: 01/25/2021 Document Reviewed: 01/05/2020 Elsevier Patient Education  2023 Elsevier Inc.    Important Information About Sugar

## 2023-08-07 ENCOUNTER — Ambulatory Visit (HOSPITAL_COMMUNITY): Attending: Cardiology

## 2023-08-07 DIAGNOSIS — I5020 Unspecified systolic (congestive) heart failure: Secondary | ICD-10-CM | POA: Diagnosis not present

## 2023-08-07 LAB — MYOCARDIAL PERFUSION IMAGING
Angina Index: 0
Duke Treadmill Score: 5
Estimated workload: 6.6
Exercise duration (min): 4 min
Exercise duration (sec): 41 s
LV dias vol: 258 mL (ref 62–150)
LV sys vol: 216 mL
MPHR: 168 {beats}/min
Nuc Stress EF: 16 %
Peak HR: 164 {beats}/min
Percent HR: 97 %
Rest HR: 88 {beats}/min
Rest Nuclear Isotope Dose: 10.9 mCi
SDS: 3
SRS: 0
SSS: 3
ST Depression (mm): 0 mm
Stress Nuclear Isotope Dose: 31.2 mCi
TID: 1.06

## 2023-08-07 MED ORDER — TECHNETIUM TC 99M TETROFOSMIN IV KIT
10.9000 | PACK | Freq: Once | INTRAVENOUS | Status: AC | PRN
  Administered 2023-08-07: 10.9 via INTRAVENOUS

## 2023-08-07 MED ORDER — TECHNETIUM TC 99M TETROFOSMIN IV KIT
31.2000 | PACK | Freq: Once | INTRAVENOUS | Status: AC | PRN
  Administered 2023-08-07: 31.2 via INTRAVENOUS

## 2023-08-09 ENCOUNTER — Observation Stay (HOSPITAL_COMMUNITY)
Admission: EM | Admit: 2023-08-09 | Discharge: 2023-08-11 | Disposition: A | Discharge status: Home / Self Care | Attending: Internal Medicine | Admitting: Internal Medicine

## 2023-08-09 ENCOUNTER — Encounter (HOSPITAL_COMMUNITY): Payer: Self-pay | Admitting: *Deleted

## 2023-08-09 ENCOUNTER — Other Ambulatory Visit: Payer: Self-pay

## 2023-08-09 DIAGNOSIS — B20 Human immunodeficiency virus [HIV] disease: Secondary | ICD-10-CM | POA: Diagnosis present

## 2023-08-09 DIAGNOSIS — F1090 Alcohol use, unspecified, uncomplicated: Secondary | ICD-10-CM | POA: Insufficient documentation

## 2023-08-09 DIAGNOSIS — E1122 Type 2 diabetes mellitus with diabetic chronic kidney disease: Secondary | ICD-10-CM | POA: Insufficient documentation

## 2023-08-09 DIAGNOSIS — I152 Hypertension secondary to endocrine disorders: Secondary | ICD-10-CM | POA: Diagnosis present

## 2023-08-09 DIAGNOSIS — Z79899 Other long term (current) drug therapy: Secondary | ICD-10-CM | POA: Diagnosis not present

## 2023-08-09 DIAGNOSIS — I5022 Chronic systolic (congestive) heart failure: Secondary | ICD-10-CM | POA: Diagnosis not present

## 2023-08-09 DIAGNOSIS — I130 Hypertensive heart and chronic kidney disease with heart failure and stage 1 through stage 4 chronic kidney disease, or unspecified chronic kidney disease: Secondary | ICD-10-CM | POA: Insufficient documentation

## 2023-08-09 DIAGNOSIS — N189 Chronic kidney disease, unspecified: Secondary | ICD-10-CM | POA: Diagnosis present

## 2023-08-09 DIAGNOSIS — N1831 Chronic kidney disease, stage 3a: Secondary | ICD-10-CM | POA: Insufficient documentation

## 2023-08-09 DIAGNOSIS — Z7984 Long term (current) use of oral hypoglycemic drugs: Secondary | ICD-10-CM | POA: Insufficient documentation

## 2023-08-09 DIAGNOSIS — E119 Type 2 diabetes mellitus without complications: Secondary | ICD-10-CM

## 2023-08-09 DIAGNOSIS — R799 Abnormal finding of blood chemistry, unspecified: Secondary | ICD-10-CM | POA: Diagnosis present

## 2023-08-09 DIAGNOSIS — N179 Acute kidney failure, unspecified: Secondary | ICD-10-CM | POA: Diagnosis not present

## 2023-08-09 DIAGNOSIS — F17210 Nicotine dependence, cigarettes, uncomplicated: Secondary | ICD-10-CM | POA: Insufficient documentation

## 2023-08-09 LAB — CBC
HCT: 43.8 % (ref 39.0–52.0)
Hemoglobin: 14 g/dL (ref 13.0–17.0)
MCH: 26.2 pg (ref 26.0–34.0)
MCHC: 32 g/dL (ref 30.0–36.0)
MCV: 81.9 fL (ref 80.0–100.0)
Platelets: 250 10*3/uL (ref 150–400)
RBC: 5.35 MIL/uL (ref 4.22–5.81)
RDW: 15.4 % (ref 11.5–15.5)
WBC: 5.7 10*3/uL (ref 4.0–10.5)
nRBC: 0 % (ref 0.0–0.2)

## 2023-08-09 LAB — BASIC METABOLIC PANEL
Anion gap: 9 (ref 5–15)
BUN: 28 mg/dL — ABNORMAL HIGH (ref 6–20)
CO2: 25 mmol/L (ref 22–32)
Calcium: 9.4 mg/dL (ref 8.9–10.3)
Chloride: 102 mmol/L (ref 98–111)
Creatinine, Ser: 2.3 mg/dL — ABNORMAL HIGH (ref 0.61–1.24)
GFR, Estimated: 33 mL/min — ABNORMAL LOW (ref >60)
Glucose, Bld: 195 mg/dL — ABNORMAL HIGH (ref 70–99)
Potassium: 3.8 mmol/L (ref 3.5–5.1)
Sodium: 136 mmol/L (ref 135–145)

## 2023-08-09 NOTE — ED Provider Triage Note (Signed)
 Emergency Medicine Provider Triage Evaluation Note  Kevin Burke , a 53 y.o. male  was evaluated in triage.  Pt complains of abnormal labs.  States that back in January he had a follow-up appointment with doctor where they noted that his creatinine was declining.  Reports he would back today for follow-up and they noticed worsening kidney function.  Sent here for evaluation.  Denies any medical complaints.  Review of Systems  Positive:  Negative:   Physical Exam  BP (!) 152/104 (BP Location: Right Arm)   Pulse (!) 102   Temp 99.1 F (37.3 C)   Resp 16   Ht 5\' 6"  (1.676 m)   Wt 74.4 kg   SpO2 99%   BMI 26.47 kg/m  Gen:   Awake, no distress   Resp:  Normal effort  MSK:   Moves extremities without difficulty  Other:    Medical Decision Making  Medically screening exam initiated at 7:16 PM.  Appropriate orders placed.  Kevin Burke was informed that the remainder of the evaluation will be completed by another provider, this initial triage assessment does not replace that evaluation, and the importance of remaining in the ED until their evaluation is complete.     Al Decant, PA-C 08/09/23 [phone]

## 2023-08-09 NOTE — ED Triage Notes (Signed)
 The pt had labs drawn this am  he was called later and told to come to the ed because his kidney labs were MESSED UP.

## 2023-08-10 ENCOUNTER — Observation Stay (HOSPITAL_COMMUNITY)

## 2023-08-10 ENCOUNTER — Other Ambulatory Visit: Payer: Self-pay

## 2023-08-10 DIAGNOSIS — N179 Acute kidney failure, unspecified: Secondary | ICD-10-CM | POA: Diagnosis not present

## 2023-08-10 DIAGNOSIS — N189 Chronic kidney disease, unspecified: Secondary | ICD-10-CM | POA: Diagnosis not present

## 2023-08-10 DIAGNOSIS — I5022 Chronic systolic (congestive) heart failure: Secondary | ICD-10-CM | POA: Diagnosis present

## 2023-08-10 LAB — URINALYSIS, ROUTINE W REFLEX MICROSCOPIC
Bacteria, UA: NONE SEEN
Bilirubin Urine: NEGATIVE
Glucose, UA: >=500 mg/dL — AB
Hgb urine dipstick: NEGATIVE
Ketones, ur: NEGATIVE mg/dL
Leukocytes,Ua: NEGATIVE
Nitrite: NEGATIVE
Protein, ur: 100 mg/dL — AB
Specific Gravity, Urine: 1.013 (ref 1.005–1.030)
pH: 5 (ref 5.0–8.0)

## 2023-08-10 LAB — CBC
HCT: 42.6 % (ref 39.0–52.0)
Hemoglobin: 13.4 g/dL (ref 13.0–17.0)
MCH: 25.5 pg — ABNORMAL LOW (ref 26.0–34.0)
MCHC: 31.5 g/dL (ref 30.0–36.0)
MCV: 81.1 fL (ref 80.0–100.0)
Platelets: 231 10*3/uL (ref 150–400)
RBC: 5.25 MIL/uL (ref 4.22–5.81)
RDW: 15.7 % — ABNORMAL HIGH (ref 11.5–15.5)
WBC: 5.6 10*3/uL (ref 4.0–10.5)
nRBC: 0 % (ref 0.0–0.2)

## 2023-08-10 LAB — BRAIN NATRIURETIC PEPTIDE: B Natriuretic Peptide: 106.2 pg/mL — ABNORMAL HIGH (ref 0.0–100.0)

## 2023-08-10 LAB — CBG MONITORING, ED
Glucose-Capillary: 117 mg/dL — ABNORMAL HIGH (ref 70–99)
Glucose-Capillary: 108 mg/dL — ABNORMAL HIGH (ref 70–99)

## 2023-08-10 LAB — BASIC METABOLIC PANEL
Anion gap: 10 (ref 5–15)
BUN: 27 mg/dL — ABNORMAL HIGH (ref 6–20)
CO2: 22 mmol/L (ref 22–32)
Calcium: 9.2 mg/dL (ref 8.9–10.3)
Chloride: 106 mmol/L (ref 98–111)
Creatinine, Ser: 2.12 mg/dL — ABNORMAL HIGH (ref 0.61–1.24)
GFR, Estimated: 37 mL/min — ABNORMAL LOW (ref >60)
Glucose, Bld: 111 mg/dL — ABNORMAL HIGH (ref 70–99)
Potassium: 4.2 mmol/L (ref 3.5–5.1)
Sodium: 138 mmol/L (ref 135–145)

## 2023-08-10 LAB — GLUCOSE, CAPILLARY: Glucose-Capillary: 119 mg/dL — ABNORMAL HIGH (ref 70–99)

## 2023-08-10 LAB — SODIUM, URINE, RANDOM: Sodium, Ur: 42 mmol/L

## 2023-08-10 LAB — CREATININE, URINE, RANDOM: Creatinine, Urine: 104 mg/dL

## 2023-08-10 MED ORDER — SODIUM CHLORIDE 0.9 % IV SOLN: Freq: Once | INTRAVENOUS | Status: DC

## 2023-08-10 MED ORDER — NIFEDIPINE ER OSMOTIC RELEASE 30 MG PO TB24
30.0000 mg | ORAL_TABLET | Freq: Every day | ORAL | Status: DC
  Administered 2023-08-10 – 2023-08-11 (×2): 30 mg via ORAL
  Filled 2023-08-10 (×2): qty 1

## 2023-08-10 MED ORDER — CARVEDILOL 25 MG PO TABS
25.0000 mg | ORAL_TABLET | Freq: Two times a day (BID) | ORAL | Status: DC
Start: 2023-08-10 — End: 2023-08-11
  Administered 2023-08-10 – 2023-08-11 (×3): 25 mg via ORAL
  Filled 2023-08-10: qty 2
  Filled 2023-08-10 (×2): qty 1

## 2023-08-10 MED ORDER — ONDANSETRON HCL 4 MG/2ML IJ SOLN: 4.0000 mg | Freq: Four times a day (QID) | INTRAMUSCULAR | Status: DC | PRN

## 2023-08-10 MED ORDER — SODIUM CHLORIDE 0.9% FLUSH
3.0000 mL | Freq: Two times a day (BID) | INTRAVENOUS | Status: DC
  Administered 2023-08-10 (×2): 3 mL via INTRAVENOUS

## 2023-08-10 MED ORDER — HEPARIN SODIUM (PORCINE) 5000 UNIT/ML IJ SOLN: 5000.0000 [IU] | Freq: Three times a day (TID) | INTRAMUSCULAR | Status: DC

## 2023-08-10 MED ORDER — ACETAMINOPHEN 325 MG PO TABS: 650.0000 mg | ORAL_TABLET | Freq: Four times a day (QID) | ORAL | Status: DC | PRN

## 2023-08-10 MED ORDER — SODIUM CHLORIDE 0.9 % IV BOLUS
500.0000 mL | Freq: Once | INTRAVENOUS | Status: AC
  Administered 2023-08-10: 500 mL via INTRAVENOUS

## 2023-08-10 MED ORDER — INSULIN ASPART 100 UNIT/ML IJ SOLN: 0.0000 [IU] | Freq: Every day | INTRAMUSCULAR | Status: DC

## 2023-08-10 MED ORDER — SENNOSIDES-DOCUSATE SODIUM 8.6-50 MG PO TABS: 1.0000 | ORAL_TABLET | Freq: Every evening | ORAL | Status: DC | PRN

## 2023-08-10 MED ORDER — ONDANSETRON HCL 4 MG PO TABS: 4.0000 mg | ORAL_TABLET | Freq: Four times a day (QID) | ORAL | Status: DC | PRN

## 2023-08-10 MED ORDER — INSULIN ASPART 100 UNIT/ML IJ SOLN: 0.0000 [IU] | Freq: Three times a day (TID) | INTRAMUSCULAR | Status: DC

## 2023-08-10 MED ORDER — ACETAMINOPHEN 650 MG RE SUPP: 650.0000 mg | Freq: Four times a day (QID) | RECTAL | Status: DC | PRN

## 2023-08-10 NOTE — Hospital Course (Signed)
 Kevin Burke is a 53 y.o. male with medical history significant for HFrEF (EF 30-35%), HIV, CKD stage IIIa, T2DM, HTN, tobacco use who is admitted with AKI superimposed on CKD stage IIIa.

## 2023-08-10 NOTE — ED Notes (Signed)
 Pt ambulated to the bathroom.

## 2023-08-10 NOTE — ED Notes (Signed)
 Pt asking to have pulse ox removed and has taken his BP cuff off as well. Pt eating at this time. Pt agrees to have vitals checked as needed he just does not want to leave them on.

## 2023-08-10 NOTE — Progress Notes (Signed)
 Patient seen and examined, admitted earlier this morning by Dr. Allena Katz, briefly Kevin Burke is a 52/M with chronic systolic CHF, HIV, CKD 3 AAA, type 2 diabetes mellitus, hypertension, tobacco use presented to the ED for evaluation of abnormal labs, he has a longstanding history of congestive heart failure and was hospitalized in January at [facility] in [place] for CHF exacerbation after being off of his meds for about a month, 2D echo noted EF 30-35% he was diuresed and discharged back on his cardiac meds including lisinopril, Lasix, Coreg, and eplerenone.  Subsequently started on Jardiance by his PCP in January.  Follow-up labs by PCP noted mild worsening of his kidney function subsequently sent to the emergency room, patient denies any symptoms, denies any dyspnea.,  Labs noted creatinine of 2.3 from baseline of 1.3-1.6, he was given 500 mL fluid bolus and admitted.   AKI on CKD 3 A, creatinine 2.3 compared to baseline of 1.3-1.6, asymptomatic, appears euvolemic, renal ultrasound unremarkable, holding lisinopril Lasix and eplerenone on at this time.  Will likely discontinue lisinopril and keep on as needed Lasix at discharge.  Chronic systolic CHF, he is clinically euvolemic, continue Coreg, restart Jardiance and eplerenone tomorrow  3.  Hypertension, restart Coreg and nifedipine, holding eplerenone and lisinopril  Rest of his medical problems as noted by Dr. Allena Katz this morning  Zannie Cove, MD

## 2023-08-10 NOTE — ED Notes (Signed)
 Pt refused CBG monitoring. Stated he does not need to have his sugar checked and isnt here for that. He says his sugar levels are fine.

## 2023-08-10 NOTE — H&P (Signed)
 History and Physical    Kevin Burke EAV:[id number] DOB: 1970/12/13 DOA: 08/09/2023  PCP: Emilio Aspen, MD  Patient coming from: Home  I have personally briefly reviewed patient's old medical records in [facility]  Chief Complaint: Abnormal labs  HPI: Kevin Burke is a 53 y.o. male with medical history significant for HFrEF (EF 30-35%), HIV, CKD stage IIIa, T2DM, HTN, tobacco use who presented to the ED for evaluation of abnormal labs.  Patient was admitted in January at [facility] in [place] for CHF exacerbation after being off of his meds for about 1 month.  TTE 06/15/2023 showed EF 30-35%.  Patient was diuresed and started back on his cardiac meds including lisinopril, Lasix, Coreg with new addition of eplerenone.  Patient states London Pepper was added end of January by his PCP.  Patient established with cardiology locally Dr. Tomie China.  Seen in clinic 3/7 and continued on the same medication regimen.   He had a Cardiolite exercise stress test performed on 3/12 which showed severely depressed ejection fraction of 16% and findings consistent with infarction of LV apical apex.  Patient had follow-up labs with his PCP earlier today and was notified that his kidney function has worsened.  He was recommended to come to the ED for further evaluation.  Patient states that he has been feeling fine without any issues.  He reports good urine output without dysuria.  He has not had any swelling in his extremities.  He denies chest pain, cough, dyspnea, abdominal pain, nausea, vomiting.  He reports adherence to his medications.  He denies use of NSAIDs.  He reports smoking maybe 2 cigarettes a daily.  He denies significant alcohol use.  He denies any recreational drug use.  He is adopted and does not know his biological family history.  ED Course  Labs/Imaging on admission: I have personally reviewed following labs and imaging studies.  Initial vitals showed BP 141/97, pulse 82, RR 18,  temp 97.5 F, SpO2 100% on room air.  Labs showed sodium 136, potassium 3.8, bicarb 25, BUN 28, creatinine 2.30 (baseline 1.3-1.6), serum glucose 195, WBC 5.7, hemoglobin 14.0, platelets 250,000.  Patient was given 500 cc normal saline.  The hospitalist service was consulted to admit.  Review of Systems: All systems reviewed and are negative except as documented in history of present illness above.   Past Medical History:  Diagnosis Date   CHF (congestive heart failure) (HCC)    COVID-19 virus infection 02/12/2022   HTN (hypertension) 08/17/2020   Human immunodeficiency virus (HIV) disease (HCC) 07/29/2020   Hypertriglyceridemia 08/18/2020   Hypogonadism in male    Mixed hyperlipidemia    Need for prophylactic vaccination and inoculation against influenza 02/14/2023   Occlusion of left vertebral artery    Routine screening for STI (sexually transmitted infection) 07/12/2021   Sleep disorder    Tobacco abuse 09/13/2021   Type 2 diabetes mellitus (HCC)    Vitamin D deficiency     Past Surgical History:  Procedure Laterality Date   CHOLECYSTECTOMY     HERNIA REPAIR  2018    Social History: He denies use of NSAIDs.  He reports smoking maybe 2 cigarettes a daily.  He denies significant alcohol use.  He denies any recreational drug use.    Allergies  Allergen Reactions   Bactrim Ds [Sulfamethoxazole-Trimethoprim] Hives    Hives/ itching    Dapsone Hives and Itching    Family History  Adopted: Yes     Prior to Admission  medications   Medication Sig Start Date End Date Taking? Authorizing Provider  cabotegravir & rilpivirine ER (CABENUVA) 600 & 900 MG/3ML injection Inject 1 kit into the muscle every 2 (two) months. 07/30/23  Yes Jennette Kettle, RPH-CPP  carvedilol (COREG) 25 MG tablet Take 1 tablet (25 mg total) by mouth 2 (two) times daily with a meal. 07/29/20   Comer, Belia Heman, MD  eplerenone (INSPRA) 25 MG tablet Take 25 mg by mouth daily. 06/15/23   [provider]  fluticasone (FLONASE) 50 MCG/ACT nasal spray Place into both nostrils. 04/20/22   [provider]  furosemide (LASIX) 40 MG tablet Take 1 tablet by mouth daily as needed for edema or fluid. 06/15/23 09/13/23  [provider]  glipiZIDE (GLUCOTROL) 10 MG tablet Take 10 mg by mouth daily before breakfast.    [provider]  JARDIANCE 10 MG TABS tablet Take 10 mg by mouth daily.    [provider]  lisinopril (ZESTRIL) 40 MG tablet Take 1 tablet (40 mg total) by mouth daily. 07/29/20   Gardiner Barefoot, MD  NIFEdipine (ADALAT CC) 30 MG 24 hr tablet Take 30 mg by mouth daily.    [provider]  OZEMPIC, 0.25 OR 0.5 MG/DOSE, 2 MG/3ML SOPN Inject 2 mg into the skin once a week.    [provider]    Physical Exam: Vitals:   08/09/23 1908 08/09/23 1910 08/09/23 2249 08/10/23 0008  BP: (!) 152/104  (!) 141/97 (!) 162/98  Pulse: (!) 102  82 73  Resp: 16  18 20   Temp: 99.1 F (37.3 C)  (!) 97.5 F (36.4 C) (!) 97.4 F (36.3 C)  TempSrc:   Oral Oral  SpO2: 99%  100% 100%  Weight:  74.4 kg    Height:  5\' 6"  (1.676 m)     Constitutional: Resting in bed, NAD, calm, comfortable Eyes: EOMI, lids and conjunctivae normal ENMT: Mucous membranes are moist. Posterior pharynx clear of any exudate or lesions.Normal dentition.  Neck: normal, supple, no masses. Respiratory: Faint expiratory basilar crackles. Normal respiratory effort. No accessory muscle use.  Cardiovascular: Regular rate and rhythm, no murmurs / rubs / gallops. No extremity edema. 2+ pedal pulses. Abdomen: no tenderness, no masses palpated. Musculoskeletal: no clubbing / cyanosis. No joint deformity upper and lower extremities. Good ROM, no contractures. Normal muscle tone.  Skin: no rashes, lesions, ulcers. No induration Neurologic: Sensation intact. Strength 5/5 in all 4.  Psychiatric: Normal judgment and insight. Alert and oriented x 3. Normal mood.   EKG: Ordered and  pending.  Assessment/Plan Principal Problem:   Acute kidney injury superimposed on chronic kidney disease (HCC) Active Problems:   Chronic heart failure with reduced ejection fraction (HFrEF, <= 40%) (HCC)   Hypertension associated with diabetes (HCC)   Type 2 diabetes mellitus (HCC)   Human immunodeficiency virus (HIV) disease (HCC)   Kevin Burke is a 53 y.o. male with medical history significant for HFrEF (EF 30-35%), HIV, CKD stage IIIa, T2DM, HTN, tobacco use who is admitted with AKI superimposed on CKD stage IIIa.  Assessment and Plan: Acute kidney injury superimposed on CKD stage IIIa: Creatinine 2.3 on admission compared to baseline 1.3-1.6.  Patient has been asymptomatic, reports good urine output.  He appears weak on admission.  He received 500 cc IV fluids while in the ED.  We are holding further fluids given his significantly depressed EF. -Obtain renal ultrasound -Obtain urine studies -Holding eplerenone, Jardiance, lisinopril, Lasix -Recheck labs  in AM  Chronic HFrEF: EF 30-35% by TTE 06/15/2023.  Cardiolite exercise stress test on 3/12 showed severely depressed EF 16%, abnormal LV perfusion with evidence of infarction.  He appears euvolemic on admission as above. -Continue Coreg 25 mg twice daily -Holding eplerenone, Jardiance, lisinopril, Lasix -Obtain CXR, check BNP -Strict I/O's, daily weights  Hypertension: Continue Coreg.  Holding nifedipine, eplerenone, lisinopril.  Type 2 diabetes: Holding Jardiance and Ozempic.  Placed on SSI.  HIV: Follows with ID Dr. Luciana Axe, managed on Benedict injections.   DVT prophylaxis: heparin injection 5,000 Units Start: 08/10/23 0600 Code Status: Full code Family Communication: Discussed with patient, he has discussed with family Disposition Plan: From home, dispo pending clinical progress Consults called: None Severity of Illness: The appropriate patient status for this patient is OBSERVATION. Observation status is judged to  be reasonable and necessary in order to provide the required intensity of service to ensure the patient's safety. The patient's presenting symptoms, physical exam findings, and initial radiographic and laboratory data in the context of their medical condition is felt to place them at decreased risk for further clinical deterioration. Furthermore, it is anticipated that the patient will be medically stable for discharge from the hospital within 2 midnights of admission.   Darreld Mclean MD [facility]  If 7PM-7AM, please contact night-coverage [email or web address]  08/10/2023, 1:21 AM

## 2023-08-10 NOTE — ED Provider Notes (Signed)
 [place] EMERGENCY DEPARTMENT AT [facility] Provider Note   CSN: [id number] Arrival date & time: 08/09/23  1855     History  Chief Complaint  Patient presents with   abnormal labs    Kevin Burke is a 53 y.o. male.  The history is provided by the patient and medical records.   53 year old male with history of CHF with EF of 15 to 20%, diabetes, hypertension, HIV, hyperlipidemia, presenting to the ED with abnormal labs.  He was seen by PCP today for recheck of his renal function and was called with lab results and told to come to the ER for admission.  He states he is overall feeling well.  He is always fatigued but that is nothing new from usual.  He is not having any chest pain, shortness of breath, nausea, vomiting, or diarrhea.  He denies any recent NSAID use.  No changes in his home medications.  Home Medications Prior to Admission medications   Medication Sig Start Date End Date Taking? Authorizing Provider  cabotegravir & rilpivirine ER (CABENUVA) 600 & 900 MG/3ML injection Inject 1 kit into the muscle every 2 (two) months. 07/30/23   Jennette Kettle, RPH-CPP  carvedilol (COREG) 25 MG tablet Take 1 tablet (25 mg total) by mouth 2 (two) times daily with a meal. 07/29/20   Comer, Belia Heman, MD  eplerenone (INSPRA) 25 MG tablet Take 25 mg by mouth daily. 06/15/23   [provider]  fluticasone (FLONASE) 50 MCG/ACT nasal spray Place into both nostrils. 04/20/22   [provider]  furosemide (LASIX) 40 MG tablet Take 1 tablet by mouth daily as needed for edema or fluid. 06/15/23 09/13/23  [provider]  glipiZIDE (GLUCOTROL) 10 MG tablet Take 10 mg by mouth daily before breakfast.    [provider]  JARDIANCE 10 MG TABS tablet Take 10 mg by mouth daily.    [provider]  lisinopril (ZESTRIL) 40 MG tablet Take 1 tablet (40 mg total) by mouth daily. 07/29/20   Gardiner Barefoot, MD  NIFEdipine (ADALAT CC) 30 MG 24 hr tablet Take 30 mg  by mouth daily.    [provider]  OZEMPIC, 0.25 OR 0.5 MG/DOSE, 2 MG/3ML SOPN Inject 2 mg into the skin once a week.    [provider]      Allergies    Bactrim ds [sulfamethoxazole-trimethoprim] and Dapsone    Review of Systems   Review of Systems  Constitutional:        Abnormal labs  All other systems reviewed and are negative.   Physical Exam Updated Vital Signs BP (!) 141/97 (BP Location: Left Arm)   Pulse 82   Temp (!) 97.5 F (36.4 C) (Oral)   Resp 18   Ht 5\' 6"  (1.676 m)   Wt 74.4 kg   SpO2 100%   BMI 26.47 kg/m  Physical Exam Vitals and nursing note reviewed.  Constitutional:      Appearance: He is well-developed.  HENT:     Head: Normocephalic and atraumatic.  Eyes:     Conjunctiva/sclera: Conjunctivae normal.     Pupils: Pupils are equal, round, and reactive to light.  Cardiovascular:     Rate and Rhythm: Normal rate and regular rhythm.     Heart sounds: Normal heart sounds.  Pulmonary:     Effort: Pulmonary effort is normal.     Breath sounds: Normal breath sounds.  Abdominal:     General: Bowel sounds are  normal.     Palpations: Abdomen is soft.  Musculoskeletal:        General: Normal range of motion.     Cervical back: Normal range of motion.  Skin:    General: Skin is warm and dry.  Neurological:     Mental Status: He is alert and oriented to person, place, and time.     ED Results / Procedures / Treatments   Labs (all labs ordered are listed, but only abnormal results are displayed) Labs Reviewed  BASIC METABOLIC PANEL - Abnormal; Notable for the following components:      Result Value   Glucose, Bld 195 (*)    BUN 28 (*)    Creatinine, Ser 2.30 (*)    GFR, Estimated 33 (*)    All other components within normal limits  CBC    EKG None  Radiology No results found.  Procedures Procedures    Medications Ordered in ED Medications  heparin injection 5,000 Units (has no administration in time range)   sodium chloride flush (NS) 0.9 % injection 3 mL (has no administration in time range)  acetaminophen (TYLENOL) tablet 650 mg (has no administration in time range)    Or  acetaminophen (TYLENOL) suppository 650 mg (has no administration in time range)  ondansetron (ZOFRAN) tablet 4 mg (has no administration in time range)    Or  ondansetron (ZOFRAN) injection 4 mg (has no administration in time range)  senna-docusate (Senokot-S) tablet 1 tablet (has no administration in time range)  insulin aspart (novoLOG) injection 0-9 Units (has no administration in time range)  insulin aspart (novoLOG) injection 0-5 Units (has no administration in time range)  carvedilol (COREG) tablet 25 mg (has no administration in time range)  sodium chloride 0.9 % bolus 500 mL (0 mLs Intravenous Stopped 08/10/23 0122)    ED Course/ Medical Decision Making/ A&P                                 Medical Decision Making Amount and/or Complexity of Data Reviewed ECG/medicine tests: ordered and independent interpretation performed.  Risk Prescription drug management. Decision regarding hospitalization.   53 year old male presenting to the ED from PCP office for abnormal labs.  Serum creatinine today is elevated above baseline.  He is generally asymptomatic of this.  Creatinine today is 2.3, baseline is around 1.3.  He denies any NSAID use.  He is currently on lisinopril, no recent dose changes. Will likely need to hold this.  No N/V/D, still making urine as normal. He does have significant CHF so was given judicious fluids for now.  May benefit from renal ultrasound as well.  He will require admission.  Discussed with Dr. Allena Katz-- will admit for ongoing care.  Final Clinical Impression(s) / ED Diagnoses Final diagnoses:  AKI (acute kidney injury) [facility])    Rx / DC Orders ED Discharge Orders     None         Garlon Hatchet, PA-C 08/10/23 0130    Coral Spikes, DO 08/10/23 [phone]

## 2023-08-11 DIAGNOSIS — N179 Acute kidney failure, unspecified: Secondary | ICD-10-CM | POA: Diagnosis not present

## 2023-08-11 DIAGNOSIS — N189 Chronic kidney disease, unspecified: Secondary | ICD-10-CM | POA: Diagnosis not present

## 2023-08-11 LAB — BASIC METABOLIC PANEL
Anion gap: 8 (ref 5–15)
BUN: 28 mg/dL — ABNORMAL HIGH (ref 6–20)
CO2: 27 mmol/L (ref 22–32)
Calcium: 9.2 mg/dL (ref 8.9–10.3)
Chloride: 103 mmol/L (ref 98–111)
Creatinine, Ser: 2.23 mg/dL — ABNORMAL HIGH (ref 0.61–1.24)
GFR, Estimated: 35 mL/min — ABNORMAL LOW (ref >60)
Glucose, Bld: 109 mg/dL — ABNORMAL HIGH (ref 70–99)
Potassium: 4 mmol/L (ref 3.5–5.1)
Sodium: 138 mmol/L (ref 135–145)

## 2023-08-11 NOTE — Discharge Summary (Signed)
 Physician Discharge Summary  Kevin Burke:[id number] DOB: Jul 17, 1970 DOA: 08/09/2023  PCP: Emilio Aspen, MD  Admit date: 08/09/2023 Discharge date: 08/11/2023  Time spent: 45 minutes  Recommendations for Outpatient Follow-up:  Pcp in 1 week, please check BMP at follow-up [facility] in 1 month   Discharge Diagnoses:  Principal Problem:   Acute kidney injury superimposed on chronic kidney disease (HCC) Active Problems:   Chronic heart failure with reduced ejection fraction (HFrEF, <= 40%) (HCC)   Hypertension associated with diabetes (HCC)   Type 2 diabetes mellitus (HCC)   Human immunodeficiency virus (HIV) disease (HCC)   Discharge Condition: Improved  Diet recommendation: Low-salt DM, diabetic  Filed Weights   08/09/23 1910 08/11/23 0500  Weight: 74.4 kg 73.4 kg    History of present illness:   53 y.o. male with medical history significant for HFrEF (EF 30-35%), HIV, CKD stage IIIa, T2DM, HTN, tobacco use who presented to the ED for evaluation of abnormal labs.  Patient was admitted in January at [facility] in [place] for CHF exacerbation after being off of his meds for about 1 month.  TTE 06/15/2023 showed EF 30-35%.  Patient was diuresed and started back on his cardiac meds including lisinopril, Lasix, Coreg with new addition of eplerenone.  Patient states London Pepper was added end of January by his PCP.  Patient established with cardiology locally Dr. Tomie China.  Seen in clinic 3/7 and continued on the same medication regimen.   He had a Cardiolite exercise stress test performed on 3/12 which showed severely depressed ejection fraction of 16% and findings consistent with infarction of LV apical apex.  Patient had follow-up labs with his PCP earlier today and was notified that his kidney function has worsened.  He was recommended to come to the ED for further evaluation.  Patient states that he has been feeling fine without any issues.  He reports good urine output  without dysuria.  He has not had any swelling in his extremities.  He denies chest pain, cough, dyspnea, abdominal pain, nausea, vomiting.  He reports adherence to his medications.  He denies use of NSAIDs.  He reports smoking maybe 2 cigarettes a daily. Labs showed sodium 136, potassium 3.8, bicarb 25, BUN 28, creatinine 2.30 (baseline 1.3-1.6), serum glucose 195, WBC 5.7, hemoglobin 14.0, platelets 250,000.   Hospital Course:   Acute kidney injury superimposed on CKD stage IIIa: Creatinine 2.3 on admission compared to baseline 1.3-1.6.  Patient has been asymptomatic, with good urine output.  -Renal ultrasound and urinalysis were unremarkable, vital signs were stable and he was completely asymptomatic, by holding lisinopril and eplerenone his kidney function is only started improving a bit, anticipate he will need more time off ACE inhibitors -Clinically remains asymptomatic and euvolemic, creatinine only down to 2.2 today, he is anxious to go home and asymptomatic, discharged home on oral Coreg, Jardiance, PRN Lasix.  Asked him to discontinue lisinopril and eplerenone at this time -Follow-up with PCP in 1 week, repeat labs at follow-up   Chronic HFrEF: EF 30-35% by TTE 06/15/2023.  Cardiolite exercise stress test on 3/12 showed severely depressed EF 16%, abnormal LV perfusion with evidence of infarction.  He appears euvolemic on admission as above. -Continue Coreg 25 mg twice daily -See discussion above, discontinued lisinopril and eplerenone -Continue Jardiance, PRN Lasix   Hypertension: Continue Coreg.  Nifedipine   Type 2 diabetes: Continue Jardiance and Ozempic   HIV: Follows with ID Dr. Luciana Axe, managed on Cabenuva injections  Discharge Exam:  Vitals:   08/10/23 2015 08/11/23 0800  BP: (!) 141/99 131/85  Pulse: 82 81  Resp: 16 12  Temp:  97.6 F (36.4 C)  SpO2: 98% 96%     Discharge Instructions   Discharge Instructions     Diet - low sodium heart healthy   Complete  by: As directed    Discharge instructions   Complete by: As directed    Need PCP FU and Labs in 1 week   Increase activity slowly   Complete by: As directed       Allergies as of 08/11/2023       Reactions   Bactrim Ds [sulfamethoxazole-trimethoprim] Hives   Hives/ itching    Dapsone Hives, Itching        Medication List     STOP taking these medications    eplerenone 25 MG tablet Commonly known as: INSPRA   lisinopril 40 MG tablet Commonly known as: ZESTRIL       TAKE these medications    cabotegravir & rilpivirine ER 600 & 900 MG/3ML injection Commonly known as: CABENUVA Inject 1 kit into the muscle every 2 (two) months.   carvedilol 25 MG tablet Commonly known as: COREG Take 1 tablet (25 mg total) by mouth 2 (two) times daily with a meal.   fenofibrate 160 MG tablet Take 160 mg by mouth daily.   Fish Oil 1000 MG Caps Take 2,000 mg by mouth in the morning and at bedtime.   fluticasone 50 MCG/ACT nasal spray Commonly known as: FLONASE Place into both nostrils.   furosemide 40 MG tablet Commonly known as: LASIX Take 1 tablet by mouth daily as needed for edema or fluid.   Jardiance 10 MG Tabs tablet Generic drug: empagliflozin Take 10 mg by mouth daily.   NIFEdipine 30 MG 24 hr tablet Commonly known as: ADALAT CC Take 30 mg by mouth daily.   Ozempic (0.25 or 0.5 MG/DOSE) 2 MG/3ML Sopn Generic drug: Semaglutide(0.25 or 0.5MG /DOS) Inject 2 mg into the skin once a week.   Vitamin D3 250 MCG (10000 UT) Tabs Take 10,000 Units by mouth every other day.       Allergies  Allergen Reactions   Bactrim Ds [Sulfamethoxazole-Trimethoprim] Hives    Hives/ itching    Dapsone Hives and Itching    Follow-up Information     Emilio Aspen, MD. Schedule an appointment as soon as possible for a visit in 1 week(s).   Specialty: Internal Medicine Contact information: [address] [place] [address] [phone]                   The results of significant diagnostics from this hospitalization (including imaging, microbiology, ancillary and laboratory) are listed below for reference.    Significant Diagnostic Studies: US RENAL Result Date: 08/10/2023 CLINICAL DATA:  Heart failure EXAM: RENAL / URINARY TRACT ULTRASOUND COMPLETE COMPARISON:  None Available. FINDINGS: Right Kidney: Renal measurements: 9.5 x 4.3 x 5.5 cm = volume: 118 mL. Echogenicity within normal limits. No mass or hydronephrosis visualized. Left Kidney: Renal measurements: 10.8 x 5.3 x 5.5 cm = volume: 167 mL. Echogenicity within normal limits. No mass or hydronephrosis visualized. Bladder: Appears normal for degree of bladder distention. Other: None. IMPRESSION: Normal renal ultrasound. Electronically Signed   By: Deatra Robinson M.D.   On: 08/10/2023 03:26   DG Chest 2 View Result Date: 08/10/2023 CLINICAL DATA:  Heart failure with reduced ejection fraction EXAM: CHEST - 2 VIEW COMPARISON:  07/04/2022 FINDINGS: Stable cardiomegaly. Pulmonary vascular congestion. No focal consolidation, pleural effusion, or pneumothorax. No displaced rib fractures. IMPRESSION: Cardiomegaly and pulmonary vascular congestion. Electronically Signed   By: Minerva Fester M.D.   On: 08/10/2023 01:56   MYOCARDIAL PERFUSION IMAGING Result Date: 08/07/2023   LV perfusion is abnormal. There is no evidence of ischemia. There is evidence of infarction. Defect 1: There is a small defect with severe reduction in uptake present in the apical apex location(s) that is fixed. There is abnormal wall motion in the defect area. Consistent with infarction.   Left ventricular function is abnormal. Nuclear stress EF: 16%. The left ventricular ejection fraction is severely decreased (<30%). End diastolic cavity size is severely enlarged.   Findings are consistent with infarction. The study is high risk based on LVEF.    Microbiology: No results found for this or any previous visit (from  the past 240 hours).   Labs: Basic Metabolic Panel: Recent Labs  Lab 08/09/23 1921 08/10/23 0355 08/11/23 0313  NA 136 138 138  K 3.8 4.2 4.0  CL 102 106 103  CO2 25 22 27   GLUCOSE 195* 111* 109*  BUN 28* 27* 28*  CREATININE 2.30* 2.12* 2.23*  CALCIUM 9.4 9.2 9.2   Liver Function Tests: No results for input(s): "AST", "ALT", "ALKPHOS", "BILITOT", "PROT", "ALBUMIN" in the last 168 hours. No results for input(s): "LIPASE", "AMYLASE" in the last 168 hours. No results for input(s): "AMMONIA" in the last 168 hours. CBC: Recent Labs  Lab 08/09/23 1921 08/10/23 0355  WBC 5.7 5.6  HGB 14.0 13.4  HCT 43.8 42.6  MCV 81.9 81.1  PLT 250 231   Cardiac Enzymes: No results for input(s): "CKTOTAL", "CKMB", "CKMBINDEX", "TROPONINI" in the last 168 hours. BNP: BNP (last 3 results) Recent Labs    08/10/23 0355  BNP 106.2*    ProBNP (last 3 results) No results for input(s): "PROBNP" in the last 8760 hours.  CBG: Recent Labs  Lab 08/10/23 0130 08/10/23 0757 08/10/23 1608  GLUCAP 117* 108* 119*       Signed:  Zannie Cove MD.  [facility] 08/11/2023, 11:57 AM

## 2023-08-11 NOTE — Progress Notes (Signed)
 Discharge orders received.  IV and telemetry removed.  CCMD notified.  Discharge instructions reviewed. Pt desires to ambulate self to car.  He will be driving himself home.  MD aware and fine with this plan.

## 2023-08-13 ENCOUNTER — Telehealth: Payer: Self-pay

## 2023-08-13 DIAGNOSIS — I5020 Unspecified systolic (congestive) heart failure: Secondary | ICD-10-CM

## 2023-08-13 DIAGNOSIS — R0989 Other specified symptoms and signs involving the circulatory and respiratory systems: Secondary | ICD-10-CM

## 2023-08-13 NOTE — Telephone Encounter (Signed)
-----   Message from Aundra Dubin Revankar sent at 08/08/2023  4:15 PM EDT ----- Severely depressed ejection fraction.  Continue medical therapy.  Exercise.  Recheck in 6 weeks.  Please refer him to electrophysiology for the possibility of defibrillator in the future.  He can be seen by EP in the next 2 months or so.  Copy primary Garwin Brothers, MD 08/08/2023 4:14 PM

## 2023-08-13 NOTE — Telephone Encounter (Signed)
 MyChart message

## 2023-08-14 ENCOUNTER — Other Ambulatory Visit (HOSPITAL_COMMUNITY): Payer: Self-pay

## 2023-08-14 ENCOUNTER — Other Ambulatory Visit: Payer: Self-pay

## 2023-08-14 NOTE — Telephone Encounter (Signed)
Results reviewed with pt as per Dr. Revankar's note.  Pt verbalized understanding and had no additional questions. Routed to PCP.  

## 2023-08-14 NOTE — Telephone Encounter (Signed)
 Pt is returning nurse call and is requesting a callback regarding results. Please advise

## 2023-08-15 ENCOUNTER — Telehealth: Payer: Self-pay

## 2023-08-15 NOTE — Telephone Encounter (Signed)
 [facility] Patient Advocate Encounter  Patient's medications CABENUVA have been couriered to [facility] from [facility] and will be administered at the patients appointment on 08/22/23.  Kae Heller, CPhT Specialty Pharmacy Patient [facility] for Infectious Disease Phone: [phone] Fax:  [phone]

## 2023-08-19 ENCOUNTER — Encounter: Payer: Self-pay | Admitting: Internal Medicine

## 2023-08-19 ENCOUNTER — Ambulatory Visit: Payer: Commercial Managed Care - PPO | Admitting: Internal Medicine

## 2023-08-19 ENCOUNTER — Other Ambulatory Visit: Payer: Self-pay

## 2023-08-19 VITALS — BP 163/108 | HR 86 | Temp 97.6°F | Resp 16 | Wt 158.0 lb

## 2023-08-19 DIAGNOSIS — Z113 Encounter for screening for infections with a predominantly sexual mode of transmission: Secondary | ICD-10-CM

## 2023-08-19 DIAGNOSIS — B20 Human immunodeficiency virus [HIV] disease: Secondary | ICD-10-CM | POA: Diagnosis not present

## 2023-08-19 DIAGNOSIS — E782 Mixed hyperlipidemia: Secondary | ICD-10-CM

## 2023-08-19 MED ORDER — CABOTEGRAVIR & RILPIVIRINE ER 600 & 900 MG/3ML IM SUER
1.0000 | Freq: Once | INTRAMUSCULAR | Status: AC
  Administered 2023-08-19: 1 via INTRAMUSCULAR

## 2023-08-19 NOTE — Addendum Note (Signed)
 Addended by: Marcell Anger on: 08/19/2023 10:46 AM   Modules accepted: Orders

## 2023-08-19 NOTE — Assessment & Plan Note (Signed)
 Will screen today

## 2023-08-19 NOTE — Addendum Note (Signed)
 Addended by: Harley Alto on: 08/19/2023 10:41 AM   Modules accepted: Orders

## 2023-08-19 NOTE — Assessment & Plan Note (Addendum)
 He is doing well in regards to his HIV and will continue with his Cabenuva injections: Given today.  No changes indicated.  I will check his labs today including CD4 and viral load. Reviewed with him FMLA and form filled out during the visit in regards to his visits for his Cabenuva injection and 1 day of recovery..  I have personally spent 40 minutes involved in face-to-face and non-face-to-face activities for this patient on the day of the visit. Professional time spent includes the following activities: Preparing to see the patient (review of tests), Obtaining and/or reviewing separately obtained history (admission/discharge record), Performing a medically appropriate examination and/or evaluation , Ordering medications/tests/procedures, referring and communicating with other health care professionals, Documenting clinical information in the EMR, Independently interpreting results (not separately reported), Communicating results to the patient/family/caregiver, Counseling and educating the patient/family/caregiver and Care coordination (not separately reported).

## 2023-08-19 NOTE — Progress Notes (Signed)
   Subjective:    Patient ID: Kevin Burke, male    DOB: 1971-02-12, 53 y.o.   MRN: [id number]  HPI Kevin Burke is here for follow-up of HIV. He continues on [place] and denies any issues with the injections.  He has been recent hospitalized due to acute renal insufficiency has congestive heart failure diagnosis now.  He is followed by his primary care physician and cardiology.  Also need for defibrillator.   Review of Systems  Constitutional:  Negative for fatigue.  Gastrointestinal:  Negative for diarrhea and nausea.  Skin:  Negative for rash.       Objective:   Physical Exam Eyes:     General: No scleral icterus. Pulmonary:     Effort: Pulmonary effort is normal.  Neurological:     Mental Status: He is alert.           Assessment & Plan:

## 2023-08-20 ENCOUNTER — Telehealth: Payer: Self-pay

## 2023-08-20 LAB — T-HELPER CELL (CD4) - (RCID CLINIC ONLY)
CD4 % Helper T Cell: 11 % — ABNORMAL LOW (ref 33–65)
CD4 T Cell Abs: 238 /uL — ABNORMAL LOW (ref 400–1790)

## 2023-08-20 NOTE — Telephone Encounter (Signed)
 Received form and completed by Dr Luciana Axe. Faxed today. Left voicemail updating patient. Copy placed in accordion folder in triage and front desk for pt. [name], RMA

## 2023-08-20 NOTE — Telephone Encounter (Signed)
 Received voicemail from patient stating he needs some adjustments to his FMLA forms and would like to know if he can bring those by for completion today.   Called patient back, no answer. Left message requesting that he drop the forms off with the front desk and that the office would try and get them done today, but that Dr. Luciana Axe will be seeing patients this afternoon so timeline cannot be guaranteed.   Sandie Ano, RN

## 2023-08-21 ENCOUNTER — Encounter: Payer: Commercial Managed Care - PPO | Admitting: Internal Medicine

## 2023-08-21 LAB — HIV-1 RNA QUANT-NO REFLEX-BLD
HIV 1 RNA Quant: <20 {copies}/mL — AB
HIV-1 RNA Quant, Log: <1.3 {Log_copies}/mL — AB

## 2023-08-21 LAB — RPR: RPR Ser Ql: NONREACTIVE

## 2023-08-22 ENCOUNTER — Ambulatory Visit: Payer: Commercial Managed Care - PPO | Admitting: Internal Medicine

## 2023-08-22 NOTE — Telephone Encounter (Signed)
 Patient called, states additional corrections are needed. He has emailed the forms with a list of corrections necessary. Printed this out and placed in provider's box for review.   Sandie Ano, RN

## 2023-08-28 NOTE — Telephone Encounter (Signed)
 Form with corrections made by Dr. Luciana Axe faxed to [facility] for the patient. Kevin Burke Jonathon Resides, CMA

## 2023-09-10 ENCOUNTER — Ambulatory Visit: Admitting: Cardiology

## 2023-09-17 ENCOUNTER — Encounter: Payer: Self-pay | Admitting: Cardiology

## 2023-09-17 ENCOUNTER — Ambulatory Visit: Attending: Cardiology | Admitting: Cardiology

## 2023-09-17 VITALS — BP 124/90 | HR 92 | Ht 66.0 in | Wt 169.8 lb

## 2023-09-17 DIAGNOSIS — Z79899 Other long term (current) drug therapy: Secondary | ICD-10-CM

## 2023-09-17 DIAGNOSIS — I10 Essential (primary) hypertension: Secondary | ICD-10-CM | POA: Diagnosis not present

## 2023-09-17 DIAGNOSIS — I5020 Unspecified systolic (congestive) heart failure: Secondary | ICD-10-CM

## 2023-09-17 NOTE — Patient Instructions (Addendum)
 Medication Instructions:  Your physician recommends that you continue on your current medications as directed. Please refer to the Current Medication list given to you today.  *If you need a refill on your cardiac medications before your next appointment, please call your pharmacy*   Lab Work: Today: BMP & CBC  If you have labs (blood work) drawn today and your tests are completely normal, you will receive your results only by: MyChart Message (if you have MyChart) OR A paper copy in the mail If you have any lab test that is abnormal or we need to change your treatment, we will call you to review the results.   Testing/Procedures: Your physician has requested that you have a cardiac MRI. Cardiac MRI uses a computer to create images of your heart as its beating, producing both still and moving pictures of your heart and major blood vessels. For further information please visit [email or web address]. Please follow the instruction sheet below for more information.    Follow-Up: At [facility], you and your health needs are our priority.  As part of our continuing mission to provide you with exceptional heart care, we have created designated Provider Care Teams.  These Care Teams include your primary Cardiologist (physician) and Advanced Practice Providers (APPs -  Physician Assistants and Nurse Practitioners) who all work together to provide you with the care you need, when you need it.  Your next appointment:   to  be determined after cardiac MRI  The format for your next appointment:   In Person  Provider:   Agatha Horsfall, MD    Thank you for choosing [facility]!!   Reece Cane, RN [phone]  Other Instructions    You are scheduled for Cardiac MRI at the location below.  Please arrive for your appointment at ______________ . ?  [facility] [address] [place], [place] [address] Please take advantage of the free valet parking available at the  [facility] and [employer] (Entrance C).  Proceed to the [facility] Radiology Department (First Floor) for check-in.   OR   [facility] [address] [place], [place] [address] Please go to the [facility] and check-in with the desk attendant.   Magnetic resonance imaging (MRI) is a painless test that produces images of the inside of the body without using Xrays.  During an MRI, strong magnets and radio waves work together in a Data processing manager to form detailed images.   MRI images may provide more details about a medical condition than X-rays, CT scans, and ultrasounds can provide.  You may be given earphones to listen for instructions.  You may eat a light breakfast and take medications as ordered with the exception of furosemide, hydrochlorothiazide, chlorthalidone or spironolactone (or any other fluid pill). If you are undergoing a stress MRI, please avoid stimulants for 12 hr prior to test. (I.e. Caffeine, nicotine, chocolate, or antihistamine medications)  If your provider has ordered anti-anxiety medications for this test, then you will need a driver.  An IV will be inserted into one of your veins. Contrast material will be injected into your IV. It will leave your body through your urine within a day. You may be told to drink plenty of fluids to help flush the contrast material out of your system.  You will be asked to remove all metal, including: Watch, jewelry, and other metal objects including hearing aids, hair pieces and dentures. Also wearable glucose monitoring systems (ie. Freestyle [place] and  Omnipods) (Braces and fillings normally are not a problem.)   TEST WILL TAKE APPROXIMATELY 1 HOUR  PLEASE NOTIFY SCHEDULING AT LEAST 24 HOURS IN ADVANCE IF YOU ARE UNABLE TO KEEP YOUR APPOINTMENT. [phone]  For more information and frequently asked questions, please visit our website : [email or web address]  Please call the Cardiac  Imaging Nurse Navigators with any questions/concerns. [phone] Office

## 2023-09-17 NOTE — Progress Notes (Signed)
  Electrophysiology Office Note:   Date:  09/17/2023  ID:  Kevin Burke, DOB 09-29-1970, MRN [id number]  Primary Cardiologist: Armida Lander, MD Primary Heart Failure: None Electrophysiologist: Vegas Fritze Cortland Ding, MD      History of Present Illness:   Kevin Burke is a 53 y.o. male with h/o chronic systolic heart failure, hypertension, hypertriglyceridemia, HIV, tobacco use, diabetes seen today for  for Electrophysiology evaluation of chronic systolic heart failure at the request of Rajan Revankar.    He was admitted to the hospital with chronic systolic heart failure.  His ejection fraction was found to be 30 to 35%.  Now that he has been on medical therapy, he feels improved.  He has no shortness of breath.  He has not had weight gain.  He walks on a regular basis.  He was readmitted to the hospital March 2025 when his creatinine was found to be elevated.  His lisinopril  was stopped.  He was not symptomatic at that time.  His lisinopril  and eplerenone were stopped at that time.  Today, denies symptoms of palpitations, chest pain, shortness of breath, orthopnea, PND, lower extremity edema, claudication, dizziness, presyncope, syncope, bleeding, or neurologic sequela. The patient is tolerating medications without difficulties.    Review of systems complete and found to be negative unless listed in HPI.   EP Information / Studies Reviewed:    EKG is not ordered today. EKG from 08/10/2023 reviewed which showed sinus rhythm, rate 84        Risk Assessment/Calculations:             Physical Exam:   VS:  BP (!) 124/90 (BP Location: Right Arm, Patient Position: Sitting, Cuff Size: Normal)   Pulse 92   Ht 5\' 6"  (1.676 m)   Wt 169 lb 12.8 oz (77 kg)   SpO2 96%   BMI 27.41 kg/m    Wt Readings from Last 3 Encounters:  09/17/23 169 lb 12.8 oz (77 kg)  08/19/23 158 lb (71.7 kg)  08/11/23 161 lb 14.4 oz (73.4 kg)     GEN: Well nourished, well developed in no acute distress NECK: No  JVD; No carotid bruits CARDIAC: Regular rate and rhythm, no murmurs, rubs, gallops RESPIRATORY:  Clear to auscultation without rales, wheezing or rhonchi  ABDOMEN: Soft, non-tender, non-distended EXTREMITIES:  No edema; No deformity   ASSESSMENT AND PLAN:    1.  Chronic systolic heart failure: Not on Entresto due to renal insufficiency.  Otherwise on optimal medical therapy.  His ejection fraction was reduced in January, but has not had a repeat study.  He did have a Myoview  which showed potentially scar at the apex and a reduced ejection fraction.  I think he would likely benefit from cardiac MRI to further assess his ejection fraction and to see if there is any other cause for his heart failure.  Once this is done, we Izeah Vossler bring him back to discuss possible ICD therapy.  2.  Hypertension: Diastolic is elevated but well-controlled  3.  Tobacco abuse: Smoking cessation encouraged  Follow up with Dr. Lawana Pray  pending MRI   Signed, Mackinsey Pelland Cortland Ding, MD

## 2023-09-27 LAB — BASIC METABOLIC PANEL WITH GFR
BUN/Creatinine Ratio: 10 (ref 9–20)
BUN: 19 mg/dL (ref 6–24)
CO2: 21 mmol/L (ref 20–29)
Calcium: 10 mg/dL (ref 8.7–10.2)
Chloride: 105 mmol/L (ref 96–106)
Creatinine, Ser: 1.84 mg/dL — ABNORMAL HIGH (ref 0.76–1.27)
Glucose: 112 mg/dL — ABNORMAL HIGH (ref 70–99)
Potassium: 5.1 mmol/L (ref 3.5–5.2)
Sodium: 140 mmol/L (ref 134–144)
eGFR: 44 mL/min/{1.73_m2} — ABNORMAL LOW (ref >59)

## 2023-09-30 ENCOUNTER — Other Ambulatory Visit: Payer: Self-pay

## 2023-09-30 ENCOUNTER — Other Ambulatory Visit (HOSPITAL_COMMUNITY): Payer: Self-pay

## 2023-09-30 NOTE — Progress Notes (Signed)
 Specialty Pharmacy Refill Coordination Note  TZURIEL BOEHNKE is a 53 y.o. male assessed today regarding refills of clinic administered specialty medication(s) Cabotegravir  & Rilpivirine  (CABENUVA )   Clinic requested Courier to Provider Office   Delivery date: 10/14/23   Verified address: [address] [place] [address]   Medication will be filled on 10/10/24.

## 2023-10-14 ENCOUNTER — Encounter: Payer: Commercial Managed Care - PPO | Admitting: Pharmacist

## 2023-10-14 ENCOUNTER — Telehealth: Payer: Self-pay

## 2023-10-14 NOTE — Telephone Encounter (Signed)
 [facility] Patient Advocate Encounter  Patient's medications Cabenuva  have been couriered to [facility] from [facility] and will be administered at the patients appointment on 10/17/23.  Roylene Corn, CPhT Specialty Pharmacy Patient [facility] for Infectious Disease Phone: [phone] Fax:  [phone]

## 2023-10-16 NOTE — Progress Notes (Deleted)
 HPI: Kevin Burke is a 53 y.o. male who presents to the [facility] pharmacy clinic for Cabenuva  administration.  Patient Active Problem List   Diagnosis Date Noted   Acute kidney injury superimposed on chronic kidney disease (HCC) 08/10/2023   Chronic heart failure with reduced ejection fraction (HFrEF, <= 40%) (HCC) 08/10/2023   CHF (congestive heart failure) (HCC)    Hypogonadism in male    Mixed hyperlipidemia    Occlusion of left vertebral artery    Sleep disorder    Type 2 diabetes mellitus (HCC)    Vitamin D deficiency    Need for prophylactic vaccination and inoculation against influenza 02/14/2023   COVID-19 virus infection 02/12/2022   Tobacco abuse 09/13/2021   Routine screening for STI (sexually transmitted infection) 07/12/2021   Hypertriglyceridemia 08/18/2020   Hypertension associated with diabetes (HCC) 08/17/2020   Human immunodeficiency virus (HIV) disease (HCC) 07/29/2020   DM type 2, uncontrolled, with renal complications 07/29/2020    Patient's Medications  New Prescriptions   No medications on file  Previous Medications   CABOTEGRAVIR  & RILPIVIRINE  ER (CABENUVA ) 600 & 900 MG/3ML INJECTION    Inject 1 kit into the muscle every 2 (two) months.   CARVEDILOL  (COREG ) 25 MG TABLET    Take 1 tablet (25 mg total) by mouth 2 (two) times daily with a meal.   CHOLECALCIFEROL (VITAMIN D3) 250 MCG (10000 UT) TABS    Take 10,000 Units by mouth every other day.   EPLERENONE (INSPRA) 25 MG TABLET    Take 25 mg by mouth daily.   FENOFIBRATE  160 MG TABLET    Take 160 mg by mouth daily.   FLUTICASONE (FLONASE) 50 MCG/ACT NASAL SPRAY    Place into both nostrils.   FUROSEMIDE (LASIX) 40 MG TABLET    Take 1 tablet by mouth daily as needed for edema or fluid.   JARDIANCE 10 MG TABS TABLET    Take 10 mg by mouth daily.   NIFEDIPINE  (ADALAT  CC) 30 MG 24 HR TABLET    Take 30 mg by mouth daily.   OMEGA-3 FATTY ACIDS (FISH OIL) 1000 MG CAPS    Take 2,000 mg by mouth in the morning and at  bedtime.   OZEMPIC, 0.25 OR 0.5 MG/DOSE, 2 MG/3ML SOPN    Inject 2 mg into the skin once a week.  Modified Medications   No medications on file  Discontinued Medications   No medications on file    Allergies: Allergies  Allergen Reactions   Bactrim Ds [Sulfamethoxazole-Trimethoprim] Hives    Hives/ itching    Dapsone Hives and Itching    Labs: Lab Results  Component Value Date   HIV1RNAQUANT <20 DETECTED (A) 08/19/2023   HIV1RNAQUANT Not Detected 02/14/2023   HIV1RNAQUANT <20 (H) 10/24/2022   CD4TABS 238 (L) 08/19/2023   CD4TABS 163 (L) 02/14/2023   CD4TABS 234 (L) 10/24/2022    RPR and STI Lab Results  Component Value Date   LABRPR NON-REACTIVE 08/19/2023   LABRPR NON-REACTIVE 07/12/2021   LABRPR CANCELED 07/27/2020    STI Results GC CT  07/27/2020  4:08 PM Negative  Negative     Hepatitis B Lab Results  Component Value Date   HEPBSAB NON-REACTIVE 04/16/2022   HEPBSAG NON-REACTIVE 07/27/2020   HEPBCAB NON-REACTIVE 07/27/2020   Hepatitis C Lab Results  Component Value Date   HEPCAB NON-REACTIVE 07/27/2020   Hepatitis A Lab Results  Component Value Date   HAV REACTIVE (A) 04/16/2022   Lipids: Lab Results  Component Value Date   CHOL 319 (H) 04/16/2022   TRIG 2,552 (H) 04/16/2022   HDL 30 (L) 04/16/2022   CHOLHDL 10.6 (H) 04/16/2022   LDLCALC  04/16/2022     Comment:     . LDL cholesterol not calculated. Triglyceride levels greater than 400 mg/dL invalidate calculated LDL results. . Reference range: <100 . Desirable range <100 mg/dL for primary prevention;   <70 mg/dL for patients with CHD or diabetic patients  with > or = 2 CHD risk factors. Aaron Aas LDL-C is now calculated using the Martin-Hopkins  calculation, which is a validated novel method providing  better accuracy than the Friedewald equation in the  estimation of LDL-C.  Melinda Sprawls et al. Erroll Heard. 7829;562(13): 2061-2068  (http://education.[facility].com/faq/FAQ164)     TARGET  DATE: The 25th  Assessment: Kyrell presents today for his maintenance Cabenuva  injections. Past injections were tolerated well without issues. Last HIV RNA was <20 in March and last CD4 count was 238 in March. Doing well with no issues today.  Administered cabotegravir  600mg /8mL in left upper outer quadrant of the gluteal muscle. Administered rilpivirine  900 mg/3mL in the right upper outer quadrant of the gluteal muscle. No issues with injections. He will follow up in 2 months for next set of injections.  Patient has received Hep B vaccine series in 2022-2023, but Hep B surface antibody checked after vaccinations were completed in November 2023 was negative. Patient has declined further Hep B vaccines. Also eligible for Shingrix, but has previously declined as well.  Doesn't normally get STI testing, but can offer? Needs scheduling  Plan: - Cabenuva  injections administered - Next injections scheduled for *** - Call with any issues or questions  Georga Killings, PharmD PGY-1 Pharmacy Resident

## 2023-10-17 ENCOUNTER — Ambulatory Visit: Payer: Self-pay | Admitting: Pharmacist

## 2023-10-30 NOTE — Progress Notes (Unsigned)
 HPI: Kevin Burke is a 53 y.o. male who presents to the [facility] pharmacy clinic for Cabenuva  administration. Patient is out of target window (lapsed on 05/31).   Patient Active Problem List   Diagnosis Date Noted   Acute kidney injury superimposed on chronic kidney disease (HCC) 08/10/2023   Chronic heart failure with reduced ejection fraction (HFrEF, <= 40%) (HCC) 08/10/2023   CHF (congestive heart failure) (HCC)    Hypogonadism in male    Mixed hyperlipidemia    Occlusion of left vertebral artery    Sleep disorder    Type 2 diabetes mellitus (HCC)    Vitamin D deficiency    Need for prophylactic vaccination and inoculation against influenza 02/14/2023   COVID-19 virus infection 02/12/2022   Tobacco abuse 09/13/2021   Routine screening for STI (sexually transmitted infection) 07/12/2021   Hypertriglyceridemia 08/18/2020   Hypertension associated with diabetes (HCC) 08/17/2020   Human immunodeficiency virus (HIV) disease (HCC) 07/29/2020   DM type 2, uncontrolled, with renal complications 07/29/2020    Patient's Medications  New Prescriptions   No medications on file  Previous Medications   CABOTEGRAVIR  & RILPIVIRINE  ER (CABENUVA ) 600 & 900 MG/3ML INJECTION    Inject 1 kit into the muscle every 2 (two) months.   CARVEDILOL  (COREG ) 25 MG TABLET    Take 1 tablet (25 mg total) by mouth 2 (two) times daily with a meal.   CHOLECALCIFEROL (VITAMIN D3) 250 MCG (10000 UT) TABS    Take 10,000 Units by mouth every other day.   EPLERENONE (INSPRA) 25 MG TABLET    Take 25 mg by mouth daily.   FENOFIBRATE  160 MG TABLET    Take 160 mg by mouth daily.   FLUTICASONE (FLONASE) 50 MCG/ACT NASAL SPRAY    Place into both nostrils.   FUROSEMIDE (LASIX) 40 MG TABLET    Take 1 tablet by mouth daily as needed for edema or fluid.   JARDIANCE 10 MG TABS TABLET    Take 10 mg by mouth daily.   NIFEDIPINE  (ADALAT  CC) 30 MG 24 HR TABLET    Take 30 mg by mouth daily.   OMEGA-3 FATTY ACIDS (FISH OIL) 1000 MG  CAPS    Take 2,000 mg by mouth in the morning and at bedtime.   OZEMPIC, 0.25 OR 0.5 MG/DOSE, 2 MG/3ML SOPN    Inject 2 mg into the skin once a week.  Modified Medications   No medications on file  Discontinued Medications   No medications on file    Allergies: Allergies  Allergen Reactions   Bactrim Ds [Sulfamethoxazole-Trimethoprim] Hives    Hives/ itching    Dapsone Hives and Itching    Labs: Lab Results  Component Value Date   HIV1RNAQUANT <20 DETECTED (A) 08/19/2023   HIV1RNAQUANT Not Detected 02/14/2023   HIV1RNAQUANT <20 (H) 10/24/2022   CD4TABS 238 (L) 08/19/2023   CD4TABS 163 (L) 02/14/2023   CD4TABS 234 (L) 10/24/2022    RPR and STI Lab Results  Component Value Date   LABRPR NON-REACTIVE 08/19/2023   LABRPR NON-REACTIVE 07/12/2021   LABRPR CANCELED 07/27/2020    STI Results GC CT  07/27/2020  4:08 PM Negative  Negative     Hepatitis B Lab Results  Component Value Date   HEPBSAB NON-REACTIVE 04/16/2022   HEPBSAG NON-REACTIVE 07/27/2020   HEPBCAB NON-REACTIVE 07/27/2020   Hepatitis C Lab Results  Component Value Date   HEPCAB NON-REACTIVE 07/27/2020   Hepatitis A Lab Results  Component Value Date  HAV REACTIVE (A) 04/16/2022   Lipids: Lab Results  Component Value Date   CHOL 319 (H) 04/16/2022   TRIG 2,552 (H) 04/16/2022   HDL 30 (L) 04/16/2022   CHOLHDL 10.6 (H) 04/16/2022   LDLCALC  04/16/2022     Comment:     . LDL cholesterol not calculated. Triglyceride levels greater than 400 mg/dL invalidate calculated LDL results. . Reference range: <100 . Desirable range <100 mg/dL for primary prevention;   <70 mg/dL for patients with CHD or diabetic patients  with > or = 2 CHD risk factors. Aaron Aas LDL-C is now calculated using the Martin-Hopkins  calculation, which is a validated novel method providing  better accuracy than the Friedewald equation in the  estimation of LDL-C.  Melinda Sprawls et al. Erroll Heard. 1610;960(45): 2061-2068   (http://education.[facility].com/faq/FAQ164)     TARGET DATE: ***  Assessment: Kevin Burke presents today for maintenance Cabenuva  injections. Past injections were tolerated well without issues. Last HIV RNA was undetectable in March 2025. Doing well with no issues today.  Administered cabotegravir  600mg /52mL in left upper outer quadrant of the gluteal muscle. Administered rilpivirine  900 mg/3mL in the right upper outer quadrant of the gluteal muscle. No issues with injections. *** will follow up in 1 months for next set of injections.  Plan: - Cabenuva  injections administered -New target date:  - Next injections scheduled for *** - Call with any issues or questions  Tolu Roney Youtz, PharmD [employer] PGY-1

## 2023-10-31 ENCOUNTER — Ambulatory Visit: Admitting: Pharmacist

## 2023-10-31 ENCOUNTER — Other Ambulatory Visit (HOSPITAL_COMMUNITY)

## 2023-10-31 ENCOUNTER — Other Ambulatory Visit: Payer: Self-pay

## 2023-10-31 DIAGNOSIS — B20 Human immunodeficiency virus [HIV] disease: Secondary | ICD-10-CM | POA: Diagnosis not present

## 2023-10-31 MED ORDER — CABOTEGRAVIR & RILPIVIRINE ER 600 & 900 MG/3ML IM SUER
1.0000 | Freq: Once | INTRAMUSCULAR | Status: AC
  Administered 2023-10-31: 1 via INTRAMUSCULAR

## 2023-11-15 ENCOUNTER — Other Ambulatory Visit (HOSPITAL_COMMUNITY)

## 2023-11-15 ENCOUNTER — Encounter (HOSPITAL_COMMUNITY): Payer: Self-pay

## 2023-11-19 ENCOUNTER — Ambulatory Visit (HOSPITAL_COMMUNITY)
Admission: RE | Admit: 2023-11-19 | Discharge: 2023-11-19 | Disposition: A | Discharge status: Home / Self Care | Source: Ambulatory Visit | Attending: Cardiology | Admitting: Cardiology

## 2023-11-19 ENCOUNTER — Other Ambulatory Visit: Payer: Self-pay | Admitting: Cardiology

## 2023-11-19 DIAGNOSIS — I5020 Unspecified systolic (congestive) heart failure: Secondary | ICD-10-CM

## 2023-11-19 MED ORDER — GADOBUTROL 1 MMOL/ML IV SOLN
10.0000 mL | Freq: Once | INTRAVENOUS | Status: AC | PRN
  Administered 2023-11-19: 10 mL via INTRAVENOUS

## 2023-11-24 ENCOUNTER — Ambulatory Visit: Payer: Self-pay | Admitting: Cardiology

## 2023-12-02 ENCOUNTER — Telehealth (HOSPITAL_COMMUNITY): Payer: Self-pay | Admitting: Cardiology

## 2023-12-02 NOTE — Telephone Encounter (Signed)
 Patient called and cancelled echocardiogram due to he had to work. I have called patient back x 2 to reschedule and had to leave a voicemail. Order will be removed from the echo WQ and if patient calls abck we will reinstate the order. Thank you.

## 2023-12-03 ENCOUNTER — Other Ambulatory Visit (HOSPITAL_COMMUNITY)

## 2023-12-04 NOTE — Telephone Encounter (Signed)
Pt returning call to nurse for results

## 2023-12-09 ENCOUNTER — Other Ambulatory Visit: Payer: Self-pay

## 2023-12-09 ENCOUNTER — Other Ambulatory Visit (HOSPITAL_COMMUNITY): Payer: Self-pay

## 2023-12-09 NOTE — Progress Notes (Signed)
 Specialty Pharmacy Refill Coordination Note  Kevin Burke is a 53 y.o. male assessed today regarding refills of clinic administered specialty medication(s) Cabotegravir  & Rilpivirine  (CABENUVA )   Clinic requested Courier to Provider Office   Delivery date: 12/12/23   Verified address: [address] [place] [address]   Medication will be filled on 12/11/23.

## 2023-12-11 ENCOUNTER — Other Ambulatory Visit: Payer: Self-pay

## 2023-12-12 ENCOUNTER — Telehealth: Payer: Self-pay

## 2023-12-12 NOTE — Telephone Encounter (Signed)
 [facility] Patient Advocate Encounter  Patient's medications CABENUVA  have been couriered to [facility] from [facility] and will be administered at the patients appointment on 12/17/23.  Charmaine Sharps, CPhT Specialty Pharmacy Patient [facility] for Infectious Disease Phone: [phone] Fax:  [phone]

## 2023-12-16 ENCOUNTER — Encounter: Payer: Self-pay | Admitting: Cardiology

## 2023-12-16 ENCOUNTER — Ambulatory Visit (HOSPITAL_COMMUNITY)
Admission: RE | Admit: 2023-12-16 | Discharge: 2023-12-16 | Disposition: A | Discharge status: Home / Self Care | Source: Ambulatory Visit | Attending: Cardiology | Admitting: Cardiology

## 2023-12-16 DIAGNOSIS — I5020 Unspecified systolic (congestive) heart failure: Secondary | ICD-10-CM | POA: Diagnosis not present

## 2023-12-16 LAB — ECHOCARDIOGRAM COMPLETE
Area-P 1/2: 9.37 cm2
S' Lateral: 5.28 cm

## 2023-12-16 NOTE — Progress Notes (Signed)
 HPI: Kevin Burke is a 53 y.o. male who presents to the [facility] pharmacy clinic for Cabenuva  administration.  Patient Active Problem List   Diagnosis Date Noted   Acute kidney injury superimposed on chronic kidney disease (HCC) 08/10/2023   Chronic heart failure with reduced ejection fraction (HFrEF, <= 40%) (HCC) 08/10/2023   CHF (congestive heart failure) (HCC)    Hypogonadism in male    Mixed hyperlipidemia    Occlusion of left vertebral artery    Sleep disorder    Type 2 diabetes mellitus (HCC)    Vitamin D deficiency    Need for prophylactic vaccination and inoculation against influenza 02/14/2023   COVID-19 virus infection 02/12/2022   Tobacco abuse 09/13/2021   Routine screening for STI (sexually transmitted infection) 07/12/2021   Hypertriglyceridemia 08/18/2020   Hypertension associated with diabetes (HCC) 08/17/2020   Human immunodeficiency virus (HIV) disease (HCC) 07/29/2020   DM type 2, uncontrolled, with renal complications 07/29/2020    Patient's Medications  New Prescriptions   No medications on file  Previous Medications   CABOTEGRAVIR  & RILPIVIRINE  ER (CABENUVA ) 600 & 900 MG/3ML INJECTION    Inject 1 kit into the muscle every 2 (two) months.   CARVEDILOL  (COREG ) 25 MG TABLET    Take 1 tablet (25 mg total) by mouth 2 (two) times daily with a meal.   CHOLECALCIFEROL (VITAMIN D3) 250 MCG (10000 UT) TABS    Take 10,000 Units by mouth every other day.   EPLERENONE (INSPRA) 25 MG TABLET    Take 25 mg by mouth daily.   FENOFIBRATE  160 MG TABLET    Take 160 mg by mouth daily.   FLUTICASONE (FLONASE) 50 MCG/ACT NASAL SPRAY    Place into both nostrils.   FUROSEMIDE (LASIX) 40 MG TABLET    Take 1 tablet by mouth daily as needed for edema or fluid.   JARDIANCE 10 MG TABS TABLET    Take 10 mg by mouth daily.   NIFEDIPINE  (ADALAT  CC) 30 MG 24 HR TABLET    Take 30 mg by mouth daily.   OMEGA-3 FATTY ACIDS (FISH OIL) 1000 MG CAPS    Take 2,000 mg by mouth in the morning and at  bedtime.   OZEMPIC, 0.25 OR 0.5 MG/DOSE, 2 MG/3ML SOPN    Inject 2 mg into the skin once a week.  Modified Medications   No medications on file  Discontinued Medications   No medications on file    Allergies: Allergies  Allergen Reactions   Bactrim Ds [Sulfamethoxazole-Trimethoprim] Hives    Hives/ itching    Dapsone Hives and Itching    Labs: Lab Results  Component Value Date   HIV1RNAQUANT <20 DETECTED (A) 08/19/2023   HIV1RNAQUANT Not Detected 02/14/2023   HIV1RNAQUANT <20 (H) 10/24/2022   CD4TABS 238 (L) 08/19/2023   CD4TABS 163 (L) 02/14/2023   CD4TABS 234 (L) 10/24/2022    RPR and STI Lab Results  Component Value Date   LABRPR NON-REACTIVE 08/19/2023   LABRPR NON-REACTIVE 07/12/2021   LABRPR CANCELED 07/27/2020    STI Results GC CT  07/27/2020  4:08 PM Negative  Negative     Hepatitis B Lab Results  Component Value Date   HEPBSAB NON-REACTIVE 04/16/2022   HEPBSAG NON-REACTIVE 07/27/2020   HEPBCAB NON-REACTIVE 07/27/2020   Hepatitis C Lab Results  Component Value Date   HEPCAB NON-REACTIVE 07/27/2020   Hepatitis A Lab Results  Component Value Date   HAV REACTIVE (A) 04/16/2022   Lipids: Lab Results  Component Value Date   CHOL 319 (H) 04/16/2022   TRIG 2,552 (H) 04/16/2022   HDL 30 (L) 04/16/2022   CHOLHDL 10.6 (H) 04/16/2022   LDLCALC  04/16/2022     Comment:     . LDL cholesterol not calculated. Triglyceride levels greater than 400 mg/dL invalidate calculated LDL results. . Reference range: <100 . Desirable range <100 mg/dL for primary prevention;   <70 mg/dL for patients with CHD or diabetic patients  with > or = 2 CHD risk factors. SABRA LDL-C is now calculated using the Martin-Hopkins  calculation, which is a validated novel method providing  better accuracy than the Friedewald equation in the  estimation of LDL-C.  Gladis APPLETHWAITE et al. SANDREA. 7986;689(80): 2061-2068  (http://education.[employer].com/faq/FAQ164)     TARGET  DATE: The 25th  Assessment: Dan presents today for his maintenance Cabenuva  injections. Past injections were tolerated well without issues. Last HIV RNA was undetectable in March. Doing well with no issues today.  Administered cabotegravir  600mg /22mL in left upper outer quadrant of the gluteal muscle. Administered rilpivirine  900 mg/3mL in the right upper outer quadrant of the gluteal muscle. No issues with injections. He will follow up in 2 months for next set of injections.  Plan: - Cabenuva  injections administered - Next injections scheduled for 02/13/24 with Dr. Overton and 04/21/24 with me - Call with any issues or questions  Sohail Capraro L. Joelee Snoke, PharmD, BCIDP, AAHIVP, CPP Clinical Pharmacist Practitioner - Infectious Diseases Clinical Pharmacist Lead - Specialty Pharmacy [facility] for Infectious Disease

## 2023-12-17 ENCOUNTER — Ambulatory Visit: Payer: Self-pay | Admitting: Cardiology

## 2023-12-17 ENCOUNTER — Ambulatory Visit: Admitting: Pharmacist

## 2023-12-17 ENCOUNTER — Other Ambulatory Visit: Payer: Self-pay

## 2023-12-17 DIAGNOSIS — B20 Human immunodeficiency virus [HIV] disease: Secondary | ICD-10-CM | POA: Diagnosis not present

## 2023-12-17 MED ORDER — CABOTEGRAVIR & RILPIVIRINE ER 600 & 900 MG/3ML IM SUER
1.0000 | Freq: Once | INTRAMUSCULAR | Status: AC
  Administered 2023-12-17: 1 via INTRAMUSCULAR

## 2023-12-17 NOTE — Telephone Encounter (Signed)
 Pt returned me call.  Result findings reviewed with patient.  Aware scheduler will call to arrange OV within the next month, with Dr. Inocencio, to discuss ICD.  Patient verbalized understanding and agreeable to plan.

## 2023-12-24 ENCOUNTER — Telehealth: Payer: Self-pay

## 2023-12-24 NOTE — Telephone Encounter (Signed)
   Pre-operative Risk Assessment    Patient Name: YOSHIAKI KREUSER  DOB: 27-Nov-1970 MRN: [id number]   Date of last office visit: 08/02/2023 Date of next office visit: None   Request for Surgical Clearance    Procedure:  Colonoscopy, screening for colon cancer   Date of Surgery:  Clearance TBD                                 Surgeon:  Charmaine Meissner NP Surgeon's Group or Practice Name:  [facility] Phone number:  [phone] Fax number:  [phone]   Type of Clearance Requested:   - Medical    Type of Anesthesia:  Propofol   Additional requests/questions:  Per office statement: If EF <30, it need to be in the hospital, [place]: if not, okay to have in endoscopy center  Signed, Janisha Bueso M Tyrease Vandeberg   12/24/2023, 1:36 PM

## 2023-12-25 ENCOUNTER — Telehealth: Payer: Self-pay

## 2023-12-25 NOTE — Telephone Encounter (Signed)
  Patient Consent for Virtual Visit        [name] has provided verbal consent on 12/25/2023 for a virtual visit (video or telephone).   CONSENT FOR VIRTUAL VISIT FOR:  Kevin Burke  By participating in this virtual visit I agree to the following:  I hereby voluntarily request, consent and authorize [facility] and its employed or contracted physicians, physician assistants, nurse practitioners or other licensed health care professionals (the Practitioner), to provide me with telemedicine health care services (the "Services) as deemed necessary by the treating Practitioner. I acknowledge and consent to receive the Services by the Practitioner via telemedicine. I understand that the telemedicine visit will involve communicating with the Practitioner through live audiovisual communication technology and the disclosure of certain medical information by electronic transmission. I acknowledge that I have been given the opportunity to request an in-person assessment or other available alternative prior to the telemedicine visit and am voluntarily participating in the telemedicine visit.  I understand that I have the right to withhold or withdraw my consent to the use of telemedicine in the course of my care at any time, without affecting my right to future care or treatment, and that the Practitioner or I may terminate the telemedicine visit at any time. I understand that I have the right to inspect all information obtained and/or recorded in the course of the telemedicine visit and may receive copies of available information for a reasonable fee.  I understand that some of the potential risks of receiving the Services via telemedicine include:  Delay or interruption in medical evaluation due to technological equipment failure or disruption; Information transmitted may not be sufficient (e.g. poor resolution of images) to allow for appropriate medical decision making by the Practitioner;  and/or  In rare instances, security protocols could fail, causing a breach of personal health information.  Furthermore, I acknowledge that it is my responsibility to provide information about my medical history, conditions and care that is complete and accurate to the best of my ability. I acknowledge that Practitioner's advice, recommendations, and/or decision may be based on factors not within their control, such as incomplete or inaccurate data provided by me or distortions of diagnostic images or specimens that may result from electronic transmissions. I understand that the practice of medicine is not an exact science and that Practitioner makes no warranties or guarantees regarding treatment outcomes. I acknowledge that a copy of this consent can be made available to me via my patient portal [facility] MyChart), or I can request a printed copy by calling the office of  [facility].    I understand that my insurance will be billed for this visit.   I have read or had this consent read to me. I understand the contents of this consent, which adequately explains the benefits and risks of the Services being provided via telemedicine.  I have been provided ample opportunity to ask questions regarding this consent and the Services and have had my questions answered to my satisfaction. I give my informed consent for the services to be provided through the use of telemedicine in my medical care

## 2023-12-25 NOTE — Telephone Encounter (Signed)
   Name: Kevin Burke  DOB: 1970/11/19  MRN: [id number]  Primary Cardiologist: Alvan Carrier, MD   Preoperative team, please contact this patient and set up a phone call appointment for further preoperative risk assessment. Please obtain consent and complete medication review. Thank you for your help. Last seen by Dr. Edwyna on 12/17/2023.   I confirm that guidance regarding antiplatelet and oral anticoagulation therapy has been completed and, if necessary, noted below.  Patient is not on anticoagulation or antiplatelet per review of medical record in Epic.    I also confirmed the patient resides in the state of [place] . As per [facility] Medical Board telemedicine laws, the patient must reside in the state in which the provider is licensed.   [name], NP 12/25/2023, 1:11 PM [facility]

## 2023-12-25 NOTE — Telephone Encounter (Signed)
 Preop tele appt now scheduled, med rec and consent done

## 2023-12-30 NOTE — Progress Notes (Unsigned)
  Electrophysiology Office Note:   Date:  12/31/2023  ID:  Kevin Burke, DOB [birth year], MRN [id number]  Primary Cardiologist: Kevin Carrier, MD Primary Heart Failure: None Electrophysiologist: Kevin Quincy Gladis Norton, MD      History of Present Illness:   Kevin Burke is a 53 y.o. male with h/o chronic systolic heart failure, hypertension, hypertriglyceridemia, HIV, tobacco abuse, diabetes seen today for routine electrophysiology followup.   Since last being seen in our clinic the patient reports doing well.  He is minimally symptomatic from his heart failure.  He is able to do his daily activities without restriction.  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  he denies chest pain, palpitations, dyspnea, PND, orthopnea, nausea, vomiting, dizziness, syncope, edema, weight gain, or early satiety.   Review of systems complete and found to be negative unless listed in HPI.   EP Information / Studies Reviewed:    EKG is not ordered today. EKG from 08/10/2023 reviewed which showed sinus rhythm, LVH      Risk Assessment/Calculations:           Physical Exam:   VS:  BP 112/80 (BP Location: Left Arm)   Pulse 92   Ht 5' 6 (1.676 m)   Wt 167 lb (75.8 kg)   SpO2 98%   BMI 26.95 kg/m    Wt Readings from Last 3 Encounters:  12/31/23 167 lb (75.8 kg)  09/17/23 169 lb 12.8 oz (77 kg)  08/19/23 158 lb (71.7 kg)     GEN: Well nourished, well developed in no acute distress NECK: No JVD; No carotid bruits CARDIAC: Regular rate and rhythm, no murmurs, rubs, gallops RESPIRATORY:  Clear to auscultation without rales, wheezing or rhonchi  ABDOMEN: Soft, non-tender, non-distended EXTREMITIES:  No edema; No deformity   ASSESSMENT AND PLAN:    1.  Chronic systolic heart failure: Not on Entresto due to renal insufficiency.  Otherwise on optimal medical therapy.  Ejection fraction 25 to 30%.  He Kevin Burke benefit from ICD therapy for primary prevention.  Risks and benefits were discussed.  He would  like to further consider his risks and benefits of an ICD.  For now, he Kevin Burke hold off.  We did discuss both SICD and transvenous ICD.  2.  Hypertension: Well-controlled  3.  Tobacco abuse: Complete cessation encouraged  Follow up with Dr. Norton pending decision on ICD   Signed, Kevin Peden Gladis Norton, MD

## 2023-12-31 ENCOUNTER — Ambulatory Visit: Attending: Cardiology | Admitting: Cardiology

## 2023-12-31 ENCOUNTER — Encounter: Payer: Self-pay | Admitting: Cardiology

## 2023-12-31 VITALS — BP 112/80 | HR 92 | Ht 66.0 in | Wt 167.0 lb

## 2023-12-31 DIAGNOSIS — I5022 Chronic systolic (congestive) heart failure: Secondary | ICD-10-CM

## 2023-12-31 DIAGNOSIS — I10 Essential (primary) hypertension: Secondary | ICD-10-CM | POA: Diagnosis not present

## 2023-12-31 NOTE — Patient Instructions (Signed)
 Medication Instructions:  Your physician recommends that you continue on your current medications as directed. Please refer to the Current Medication list given to you today.  *If you need a refill on your cardiac medications before your next appointment, please call your pharmacy*  Lab Work: None ordered  If you have any lab test that is abnormal or we need to change your treatment, we will call you to review the results.  Testing/Procedures: None ordered  Follow-Up: At [facility], you and your health needs are our priority.  As part of our continuing mission to provide you with exceptional heart care, our providers are all part of one team.  This team includes your primary Cardiologist (physician) and Advanced Practice Providers or APPs (Physician Assistants and Nurse Practitioners) who all work together to provide you with the care you need, when you need it.  Your next appointment:   to be determined  Provider:   Soyla Norton, MD     Thank you for choosing [facility]!!   Maeola Domino, RN [phone]   Other Instructions Please review the following information and call the office and let us  know your decision.   Cardioverter Defibrillator Implantation An implantable cardioverter defibrillator (ICD) is a device that detects abnormal heart rhythms, also called arrhythmias. When the ICD senses a heart rhythm that's not normal, it sends an electrical signal to get the heartbeat back into a normal rhythm. In the implantation surgery, the ICD is placed under the skin in your chest or abdomen. An ICD has a battery and a small computer called a pulse generator. It also has wires, called leads, that go into the heart. The ICD detects and corrects two types of dangerous heart rhythms: Ventricular tachycardia. This is a very fast heart rhythm in the lower chambers of the heart. These chambers are called the ventricles. Ventricular fibrillation. This is when the  ventricles contract in an uncoordinated way. Your health care provider may suggest an ICD if: You had an abnormal heart rhythm that started in the ventricles. Your heart has damage from a disease or heart condition. Your heart muscle is weak. You had a cardiac arrest before. You have: A congenital heart defect. This is a heart problem you were born with. You have other health problems that can affect your heart's electrical system. Tell a health care provider about: Any allergies you have. All medicines you're taking. These include vitamins, herbs, eye drops, creams, and over-the-counter medicines. Any problems you or family members have had with anesthesia. Any bleeding problems you have. Any surgeries you have had. Any medical conditions you have. Whether you're pregnant or may be pregnant. What are the risks? Your provider will talk with you about risks. These may include: Infection. Bleeding. Allergic reactions to medicines. Blood clots. Swelling or bruising. Damage to nearby structures or organs. These might be nerves, lungs, blood vessels, or the heart where parts of the ICD are placed. What happens before the procedure? When to stop eating and drinking Follow instructions from your provider about what you may eat and drink. These may include: 8 hours before your procedure Stop eating most foods. Do not eat meat, fried foods, or fatty foods. Eat only light foods, such as toast or crackers. All liquids are okay except energy drinks and alcohol. 6 hours before your procedure Stop eating. Drink only clear liquids, such as water, clear fruit juice, black coffee, plain tea, and sports drinks. Do not drink energy drinks or alcohol. 2 hours  before your procedure Stop drinking all liquids. You may be allowed to take medicines with small sips of water. If you don't follow your provider's instructions, your procedure may be delayed or canceled. Medicines Ask your provider  about: Changing or stopping your regular medicines. These include any diabetes medicines or blood thinners you take. Taking medicines such as aspirin and ibuprofen. These medicines can thin your blood. Do not take them unless your provider tells you to. Taking over-the-counter medicines, vitamins, herbs, and supplements. Tests You may have an exam or testing. These may include: Blood tests. Electrocardiogram (ECG). This test records the electrical signals in your heart. Imaging tests, such as a chest X-ray. Echocardiogram. This uses sound waves to make pictures of your heart. An event monitor or Holter monitor to wear at home. These track your heart rhythm. General instructions Do not use any products that contain nicotine or tobacco for at least 4 weeks before the procedure. These products include cigarettes, chewing tobacco, and vaping devices, such as e-cigarettes. If you need help quitting, ask your provider. Ask your provider: How your procedure site will be marked. What steps will be taken to help prevent infection. These may include: Removing hair at the surgery site. Washing skin with a soap that kills germs. Taking antibiotics. If you'll be going home right after the procedure, plan to have a responsible adult: Take you home from the hospital or clinic. You won't be allowed to drive. Care for you for the time you are told. What happens during the procedure?  Monitors will be put on your body. They will be used to check your heart rate, blood pressure, and oxygen level. A pair of sticky pads, called defibrillator pads, may be placed on your back and chest. These pads can pace your heart as needed during the procedure. An IV will be inserted into one of your veins. You may be given: A sedative. This helps you relax. Anesthesia. This keeps you from feeling pain. It will make you fall asleep for surgery. A small incision will be made to create a deep pocket under the skin of your  chest or abdomen. Leads will be guided through a blood vessel into your heart and attached to your heart muscles. An X-ray machine called a fluoroscope will be used to help guide the leads. Depending on the ICD, the leads may go into one ventricle, or they may go into both ventricles and into an upper chamber of the heart. The other end of the leads will be attached to the ICD's pulse generator. The pulse generator will be placed into the pocket under your skin. The ICD will be tested, and your provider will program the ICD for the condition being treated. The incision will be closed with stitches, skin glue, tape strips, or staples. A bandage will be placed over the incision. The procedure may vary among providers and hospitals. What happens after the procedure? Your blood pressure, heart rate, breathing rate, and blood oxygen level will be monitored until you leave the hospital or clinic. A chest X-ray will be done to check the ICD. Do not raise your arm higher than your shoulder for as long as told. This is usually at least 6 weeks. You may be given an ID card that shows you have an ICD. You'll be given a remote home monitoring device to use with your ICD. It allows your device to communicate with your provider. Do not drive until your provider says it's safe. This information is  not intended to replace advice given to you by your health care provider. Make sure you discuss any questions you have with your health care provider. Document Revised: 08/02/2022 Document Reviewed: 08/02/2022 Elsevier Patient Education  2024 [employer] Inc[employer]       Subcutaneous Cardioverter Defibrillator Implantation  A subcutaneous implantable cardioverter defibrillator (S-ICD) is a device that detects and corrects abnormal heart rhythms, also called arrhythmias. In the implantation surgery, the S-ICD is placed under the skin in your chest. An S-ICD has a battery and a small computer called a pulse generator. It  also has a wire, called an electrode, that goes into the heart. The S-ICD detects and corrects two types of dangerous heart rhythms: Ventricular tachycardia. This is a very fast heart rhythm in the lower chambers of the heart. These chambers are called the ventricles. Ventricular fibrillation. This is when the ventricles contract in an uncoordinated way. When the S-ICD detects ventricular tachycardia or fibrillation, it sends a shock to the heart that tries to get the heartbeat back to normal. This is called defibrillation. The shock may feel like a strong jolt in the chest. Your health care provider may suggest an S-ICD if: You had an abnormal heart rhythm that started in the ventricles. Your heart has damage from heart disease or a heart condition. Your heart muscle is weak. You were born with heart disease. This is called congenital heart disease. You have had a cardiac arrest before. Your provider may suggest an S-ICD instead of a traditional ICD if: You have a blood vessel problem, or vascular disease. You have a condition that raises your risk for infection or you've had an ICD infection before. You're younger in age and will need an ICD for a long time. Tell a health care provider about: Any allergies you have. All medicines you're taking. These include vitamins, herbs, eye drops, creams, and over-the-counter medicines. Any problems you or family members have had with anesthesia. Any bleeding problems you have. Any surgeries you have had. Any medical conditions you have. Whether you're pregnant or may be pregnant. What are the risks? Your provider will talk with you about risks. These may include: Infection. Bleeding. Allergic reactions to medicines or dyes. Failure to shock or correct the abnormal heart rhythm. Swelling and bruising. Blood clots. Shocks from the S-ICD when they're not needed. What happens before the procedure? When to stop eating and drinking Follow  instructions from your provider about what you may eat and drink. These may include: 8 hours before your procedure Stop eating most foods. Do not eat meat, fried foods, or fatty foods. Eat only light foods, such as toast or crackers. All liquids are okay except energy drinks and alcohol. 6 hours before your procedure Stop eating. Drink only clear liquids, such as water, clear fruit juice, black coffee, plain tea, and sports drinks. Do not drink energy drinks or alcohol. 2 hours before your procedure Stop drinking all liquids. You may be allowed to take medicines with small sips of water. If you don't follow your provider's instructions, your procedure may be delayed or canceled. Medicines Ask your provider about: Changing or stopping your regular medicines. These include any diabetes medicines or blood thinners you take. Taking medicines such as aspirin and ibuprofen. These medicines can thin your blood. Do not take them unless your provider tells you to. Taking over-the-counter medicines, vitamins, herbs, and supplements. Tests You may have tests, such as: Blood tests. Electrocardiogram (ECG). This test records the electrical signals in your  heart. Imaging tests, such as a chest X-ray. Echocardiogram. This uses sound waves to make pictures of your heart. An event monitor or Holter monitor to wear at home. These track your heart rhythm. General instructions Do not use any products that contain nicotine or tobacco for at least 4 weeks before the procedure. These products include cigarettes, chewing tobacco, and vaping devices, such as e-cigarettes. If you need help quitting, ask your provider. Ask your provider: How your procedure site will be marked. What steps will be taken to help prevent infection. These may include: Removing hair at the surgery site. Washing skin with a soap that kills germs. Taking antibiotics. If you'll be going home right after the procedure, plan to have a  responsible adult: Take you home from the hospital or clinic. You won't be allowed to drive. Care for you for the time you are told. What happens during the procedure? Monitors will be put on your body. They'll be used to check your heart, blood pressure, and oxygen level. An IV will be inserted into one of your veins. You may be given: A sedative. This helps you relax. Anesthesia. This keeps you from feeling pain. It will make you fall asleep for surgery. A small incision will be made on the left side of your chest near your rib cage, under your arm. A pocket or pouch will be made in the skin on the left lower part of your chest. The S-ICD pulse generator will be put into this pocket. An electrode will be placed under the skin in your chest, near your breastbone, or sternum. The electrode will be attached to the S-ICD pulse generator. The S-ICD will be tested, and your provider will program the S-ICD for the condition being treated. Your incision will be closed with stitches, glue, or tape strips. A bandage will be placed over your incision. The procedure may vary among providers and hospitals. What happens after the procedure? Your blood pressure, heart rate, breathing rate, and blood oxygen level will be monitored until you leave the hospital or clinic. You may have a chest X-ray to check the S-ICD. You'll get an ID card that shows you have an S-ICD. Keep this card with you at all times. You'll be given a remote home monitoring device to use with your S-ICD. It allows your device to communicate with your provider. Do not drive until your provider says it's safe. This information is not intended to replace advice given to you by your health care provider. Make sure you discuss any questions you have with your health care provider. Document Revised: 08/02/2022 Document Reviewed: 08/02/2022 Elsevier Patient Education  2024 [employer].

## 2024-01-08 ENCOUNTER — Ambulatory Visit: Attending: Cardiovascular Disease

## 2024-01-08 DIAGNOSIS — Z01810 Encounter for preprocedural cardiovascular examination: Secondary | ICD-10-CM

## 2024-01-08 NOTE — Progress Notes (Signed)
 Virtual Visit via Telephone Note   Because of Antoin L Charnley co-morbid illnesses, he is at least at moderate risk for complications without adequate follow up.  This format is felt to be most appropriate for this patient at this time.  Due to technical limitations with video connection (technology), today's appointment will be conducted as an audio only telehealth visit, and Kevin Burke verbally agreed to proceed in this manner.   All issues noted in this document were discussed and addressed.  No physical exam could be performed with this format.  Evaluation Performed:  Preoperative cardiovascular risk assessment _____________   Date:  01/08/2024   Patient ID:  Kevin Burke, DOB 04-Apr-1971, MRN [id number] Patient Location:  Home Provider location:   Office  Primary Care Provider:  Charlott Dorn LABOR, MD Primary Cardiologist:  Alvan Dorn, MD  Chief Complaint / Patient Profile  53 y.o. y/o male with a h/o HFrEF, dyslipidemia, tobacco use, hypertension, HIV, diabetes who is pending colonoscopy and presents today for telephonic preoperative cardiovascular risk assessment. History of Present Illness  Kevin Burke is a 53 y.o. male who presents via audio/video conferencing for a telehealth visit today.  Pt was last seen in cardiology clinic on 12/31/2023 by Dr. Inocencio.  At that time Kevin Burke was doing well.  The patient is now pending procedure as outlined above. Since his last visit, he has remained stable from a cardiac standpoint.  Today he denies chest pain, shortness of breath, lower extremity edema, fatigue, palpitations, melena, hematuria, hemoptysis, diaphoresis, weakness, presyncope, syncope, orthopnea, and PND.  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  He tolerates all activity very well. Past  Medical History    Past Medical History:  Diagnosis Date   CHF (congestive heart failure) (HCC)    COVID-19 virus infection 02/12/2022   HTN (hypertension) 08/17/2020   Human immunodeficiency virus (HIV) disease (HCC) 07/29/2020   Hypertriglyceridemia 08/18/2020   Hypogonadism in male    Mixed hyperlipidemia    Need for prophylactic vaccination and inoculation against influenza 02/14/2023   Occlusion of left vertebral artery    Routine screening for STI (sexually transmitted infection) 07/12/2021   Sleep disorder    Tobacco abuse 09/13/2021   Type 2 diabetes mellitus (HCC)    Vitamin D deficiency    Past Surgical History:  Procedure Laterality Date   CHOLECYSTECTOMY     HERNIA REPAIR  2018   Allergies Allergies  Allergen Reactions   Bactrim Ds [Sulfamethoxazole-Trimethoprim] Hives    Hives/ itching    Dapsone Hives and Itching   Home Medications    Prior to Admission medications   Medication Sig Start Date End Date Taking? Authorizing Provider  cabotegravir  & rilpivirine  ER (CABENUVA ) 600 & 900 MG/3ML injection Inject 1 kit into the muscle every 2 (two) months. 07/30/23   Waddell Alan PARAS, RPH-CPP  carvedilol  (COREG ) 25 MG tablet Take 1 tablet (25 mg total) by mouth 2 (two) times daily with a meal. 07/29/20   Comer, Lamar ORN, MD  Cholecalciferol (VITAMIN D3) 250 MCG (10000 UT) TABS Take 10,000 Units by mouth every other day.    [provider]  eplerenone (INSPRA) 25 MG tablet Take 25 mg by mouth daily. 09/12/23   [provider]  fenofibrate  160 MG tablet Take 160  mg by mouth daily.    [provider]  fluticasone (FLONASE) 50 MCG/ACT nasal spray Place into both nostrils. 04/20/22   [provider]  furosemide (LASIX) 40 MG tablet Take 1 tablet by mouth daily as needed for edema or fluid. 06/15/23 12/31/23  [provider]  JARDIANCE 10 MG TABS tablet Take 10 mg by mouth daily.    [provider]  NIFEdipine  (ADALAT  CC) 30 MG 24  hr tablet Take 30 mg by mouth daily.    [provider]  Omega-3 Fatty Acids (FISH OIL) 1000 MG CAPS Take 2,000 mg by mouth in the morning and at bedtime.    [provider]  OZEMPIC, 0.25 OR 0.5 MG/DOSE, 2 MG/3ML SOPN Inject 2 mg into the skin once a week.    [provider]    Physical Exam  Vital Signs:  Kevin Burke does not have vital signs available for review today. Given telephonic nature of communication, physical exam is limited. AAOx3. NAD. Normal affect.  Speech and respirations are unlabored. Accessory Clinical Findings  None Assessment & Plan    1.  Preoperative Cardiovascular Risk Assessment: Kevin Burke perioperative risk of a major cardiac event is 6.6% according to the Revised Cardiac Risk Index (RCRI).  Therefore, he is at high risk for perioperative complications.   His functional capacity is good at 7.99 METs according to the Duke Activity Status Index (DASI). Recommendations: According to ACC/AHA guidelines, no further cardiovascular testing needed.  The patient may proceed to surgery at acceptable risk.  Echocardiogram on 12/16/2023 indicated LVEF 25 to 30%, recommend that patient have colonoscopy completed at [facility]. Antiplatelet and/or Anticoagulation Recommendations: None requested.   The patient was advised that if he develops new symptoms prior to surgery to contact our office to arrange for a follow-up visit, and he verbalized understanding.  A copy of this note will be routed to requesting surgeon.  Time:   Today, I have spent 10 minutes with the patient with telehealth technology discussing medical history, symptoms, and management plan.    Crucita Lacorte D Hernan Turnage, NP  01/08/2024, 9:10 AM

## 2024-01-10 ENCOUNTER — Telehealth: Payer: Self-pay | Admitting: Cardiology

## 2024-01-10 DIAGNOSIS — Z01812 Encounter for preprocedural laboratory examination: Secondary | ICD-10-CM

## 2024-01-10 DIAGNOSIS — I5022 Chronic systolic (congestive) heart failure: Secondary | ICD-10-CM

## 2024-01-10 NOTE — Telephone Encounter (Signed)
 Patient would like to schedule ICD implant.

## 2024-01-10 NOTE — Telephone Encounter (Signed)
 Spoke wih pt. Placed on waitlist.

## 2024-01-30 NOTE — Addendum Note (Signed)
 Addended by: GRETEL MAEOLA CROME on: 01/30/2024 03:57 PM   Modules accepted: Orders

## 2024-01-30 NOTE — Telephone Encounter (Signed)
 Patient calling to f/u. He said he called two weeks ago and never got a callback.

## 2024-01-30 NOTE — Telephone Encounter (Signed)
 Apologized to pt for any confusion.  Patient would like to proceed with traditional ICD, not an SICD. Scheduled for 03/12/2024.  Pt aware instructions will come via mychart and that someone would be in touch to review those.  Pt agreeable to plan.

## 2024-02-10 ENCOUNTER — Other Ambulatory Visit (HOSPITAL_COMMUNITY): Payer: Self-pay

## 2024-02-10 ENCOUNTER — Other Ambulatory Visit: Payer: Self-pay

## 2024-02-10 NOTE — Progress Notes (Signed)
 Specialty Pharmacy Refill Coordination Note  Kevin Burke is a 53 y.o. male assessed today regarding refills of clinic administered specialty medication(s) Cabotegravir  & Rilpivirine  (CABENUVA )   Clinic requested Courier to Provider Office   Delivery date: 02/12/24   Verified address: [address] [place] [address]   Medication will be filled on 02/11/24.

## 2024-02-11 ENCOUNTER — Other Ambulatory Visit: Payer: Self-pay

## 2024-02-12 ENCOUNTER — Telehealth: Payer: Self-pay

## 2024-02-12 NOTE — Telephone Encounter (Signed)
 [facility] Patient Advocate Encounter  Patient's medications CABENUVA  have been couriered to [facility] from [facility] and will be administered at the patients appointment on 02/13/24.  Charmaine Sharps, CPhT Specialty Pharmacy Patient [facility] Phone: [phone] Fax:  [phone]

## 2024-02-13 ENCOUNTER — Ambulatory Visit: Admitting: Internal Medicine

## 2024-02-13 ENCOUNTER — Encounter: Payer: Self-pay | Admitting: Internal Medicine

## 2024-02-13 ENCOUNTER — Other Ambulatory Visit: Payer: Self-pay

## 2024-02-13 ENCOUNTER — Telehealth: Payer: Self-pay | Admitting: Cardiology

## 2024-02-13 VITALS — BP 149/98 | HR 98 | Temp 97.8°F | Ht 66.0 in | Wt 165.0 lb

## 2024-02-13 DIAGNOSIS — Z23 Encounter for immunization: Secondary | ICD-10-CM | POA: Diagnosis not present

## 2024-02-13 DIAGNOSIS — Z0279 Encounter for issue of other medical certificate: Secondary | ICD-10-CM

## 2024-02-13 DIAGNOSIS — J069 Acute upper respiratory infection, unspecified: Secondary | ICD-10-CM

## 2024-02-13 DIAGNOSIS — B20 Human immunodeficiency virus [HIV] disease: Secondary | ICD-10-CM

## 2024-02-13 DIAGNOSIS — Z1159 Encounter for screening for other viral diseases: Secondary | ICD-10-CM

## 2024-02-13 MED ORDER — HYDROCODONE BIT-HOMATROP MBR 5-1.5 MG/5ML PO SOLN: 5.0000 mL | Freq: Four times a day (QID) | ORAL | 0 refills | Status: DC | PRN

## 2024-02-13 MED ORDER — PHENYLEPH-CPM-DM-APAP 2.5-1-5-160 MG/5ML PO LIQD: 5.0000 mL | Freq: Four times a day (QID) | ORAL | 0 refills | Status: DC | PRN

## 2024-02-13 MED ORDER — CABOTEGRAVIR & RILPIVIRINE ER 600 & 900 MG/3ML IM SUER
1.0000 | Freq: Once | INTRAMUSCULAR | Status: AC
  Administered 2024-02-13: 1 via INTRAMUSCULAR

## 2024-02-13 NOTE — Progress Notes (Signed)
   Subjective:    Patient ID: Kevin Burke, male    DOB: [birth year], 53 y.o.   MRN: [id number]  HPI Kevin Burke is here for follow-up of HIV. He continues on Cabenuva  and denies any issues with the injections.  He has been recent hospitalized due to acute renal insufficiency has congestive heart failure diagnosis now.  He is followed by his primary care physician and cardiology.  Also need for defibrillator.  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  Gastrointestinal:  Negative for diarrhea and nausea.  Skin:  Negative for rash.       Objective:   Physical Exam Eyes:     General: No scleral icterus. Pulmonary:     Effort: Pulmonary effort is normal.  Neurological:     Mental Status: He is alert.     Labs: Lab Results  Component Value Date   WBC 5.6 08/10/2023   HGB 13.4 08/10/2023   HCT 42.6 08/10/2023   MCV 81.1 08/10/2023   PLT 231 08/10/2023   Last metabolic panel Lab Results  Component Value Date   GLUCOSE 112 (H) 09/26/2023   NA 140 09/26/2023   K 5.1 09/26/2023   CL 105 09/26/2023   CO2 21 09/26/2023   BUN 19 09/26/2023   CREATININE 1.84 (H) 09/26/2023   EGFR 44 (L) 09/26/2023   CALCIUM  10.0 09/26/2023   PROT 7.2 02/14/2023   BILITOT 0.8 02/14/2023   AST 17 02/14/2023   ALT 12 02/14/2023   ANIONGAP 8 08/11/2023   Lab Results  Component Value Date   HIV1RNAQUANT <20 DETECTED (A) 08/19/2023   Lab Results  Component Value Date   CD4TCELL 11 (L) 08/19/2023    CD4TABS 238 (L) 08/19/2023   07/2023 rpr negative           Assessment & Plan:   #hiv Dx'ed 2004; routine check Msm risk  Therapy: Cabenuva       -discussed u=u -encourage compliance -continue current HIV medication -labs today -f/u in 6 months; 2 months with pharmacy    #uri Getting over one, still congestion/eustachian tube dysfunction  -hycodan and phenylephrine prn -he has nasal steroid drops and he can also use as needed   #hcm -vaccination Flu shot 02/13/24 -hepatitis Vaccine in 2023; check serology -std screen He asked to defer 01/2024 -metabolic/reprieve He is already on statin; has cardiomyopathy -tb screen Will review -cancer screen Never done anal cancer screening and I advise him to review literature and we can do next visit if he likes Defer the rest of age appropriate cancer screening to pcp

## 2024-02-13 NOTE — Telephone Encounter (Signed)
 Spoke with the patient and advised that he will need to bring the forms by the office and complete and form and pay a fee in order for us  to fill them out. Patient verbalized understanding.

## 2024-02-13 NOTE — Patient Instructions (Signed)
 I have given you two medications for your upper respiratory tract symptoms Give it 2 more weeks and it should improve    Cabenuva  today     Blood test today   Please review anal cancer screening and we can do next visit    See me in 6 months

## 2024-02-13 NOTE — Telephone Encounter (Signed)
 Pt dropped off FMLA paperwork, completed ROI & Billing forms, & padi $29 fee (cash). Pt aware of 7-14 business day turnaround for paperwork completion - aware that Dr. Inocencio will not return to office until next week.  Paperwork left in Dr. Carolyn mailbox.   JB, 02-13-24

## 2024-02-13 NOTE — Telephone Encounter (Signed)
 Patient says he has FMLA paperwork that needs to be completed for 10/16 procedure. He would like a call back to discuss.

## 2024-02-15 LAB — HEPATITIS B SURFACE ANTIBODY, QUANTITATIVE: Hep B S AB Quant (Post): <5 m[IU]/mL — ABNORMAL LOW (ref >=10)

## 2024-02-15 LAB — HIV-1 RNA QUANT-NO REFLEX-BLD
HIV 1 RNA Quant: NOT DETECTED {copies}/mL
HIV-1 RNA Quant, Log: NOT DETECTED {Log_copies}/mL

## 2024-02-15 LAB — HEPATITIS B CORE ANTIBODY, TOTAL: Hep B Core Total Ab: NONREACTIVE

## 2024-02-15 LAB — HEPATITIS B SURFACE ANTIGEN: Hepatitis B Surface Ag: NONREACTIVE

## 2024-02-17 ENCOUNTER — Telehealth (HOSPITAL_COMMUNITY): Payer: Self-pay

## 2024-02-17 ENCOUNTER — Telehealth: Payer: Self-pay

## 2024-02-17 NOTE — Telephone Encounter (Signed)
Work up complete. 

## 2024-02-17 NOTE — Telephone Encounter (Signed)
 Spoke with patient to complete pre-procedure call.     Health status review:  Any new medical conditions, recent signs of acute illness or been started on antibiotics? Yes; recently treated for a sinus infection and reports he has fully recovered.   Any recent hospitalizations or surgeries? No Any new medications started since pre-op visit? No  Follow all medication instructions prior to procedure or the procedure may be rescheduled:    HOLD Semaglutide (Ozempic, Rybelsus, Wegovy) for 7 days prior to your procedure. Last dose on October 8. HOLD Jardiance for 3 days prior to procedure. Last dose October 13. Essential chronic medications:  Yes.   On the morning of your procedure take ONLY Carvedilol  with a small sip of water. The night before your procedure and the morning of your procedure, wash thoroughly with the CHG surgical soap from the neck down, paying special attention to the area where your procedure will be performed.  Nothing to eat or drink after midnight prior to your procedure.  Pre-procedure testing scheduled: lab work by October 2.  Confirmed patient is scheduled for  Implantable cardioverter defibrilator (ICD) on Thursday, October 16 with Dr. Soyla Norton. Instructed patient to arrive at the Main Entrance A at [facility]: [address] [place], [place] [address] and check in at Admitting at 1:00 PM.  Advised of plan to go home the same day and will only stay overnight if medically necessary. You MUST have a responsible adult to drive you home and MUST be with you the first 24 hours after you arrive home or your procedure could be cancelled.  Informed patient a nurse will call a day before the procedure to confirm arrival time and ensure instructions are followed.  Patient verbalized understanding to information provided and is agreeable to proceed with procedure.   Advised patient to contact RN Navigator at [phone], to inform of any new medications started  after call or concerns prior to procedure.

## 2024-02-17 NOTE — Telephone Encounter (Signed)
-----   Message from Nurse Sherri P sent at 02/13/2024 11:43 AM EDT ----- Regarding: 03/12/24 - ICD implant Precert:  MD: Camnitz Type of implant: ICD Device manufacturer:  Diagnosis: Cardiomyopathy CPT code: ICD implant - 33249 C-code(s), including quantity (if indicated): C1721 Procedure scheduled (date/time): 03/12/24, 3:00 pm  Procedure:  Scrub given? No  Medication instructions: take only Carvedilol .  Hold Ozempic 1 week prior, hold Lasix & Jardiance morning of Message sent to [facility]? No Added to calendar? Yes Orders entered? Yes Letter complete? No,  Scheduled with cath lab? Yes Labs ordered (CBC, BMET, PT/INR if on warfarin)? Yes Dye allergy? No Pre-meds ordered and instructions given?  Letter method: MyChart Special instructions:  H&P:   Follow-up:  Cassie/Angel, please schedule Routine

## 2024-02-18 NOTE — Telephone Encounter (Signed)
 Paperwork completed/signed.  Left in Laurie's mailbox to be picked up

## 2024-02-19 NOTE — Telephone Encounter (Signed)
 Completed FMLA form faxed to [employer] and scanned into chart.  Billing notified.

## 2024-02-20 NOTE — Telephone Encounter (Signed)
 Patient called back to say that his job states the forms was filled out incorrectly. Please advise

## 2024-02-25 LAB — CBC
Hematocrit: 40.1 % (ref 37.5–51.0)
Hemoglobin: 12.8 g/dL — ABNORMAL LOW (ref 13.0–17.7)
MCH: 25.2 pg — ABNORMAL LOW (ref 26.6–33.0)
MCHC: 31.9 g/dL (ref 31.5–35.7)
MCV: 79 fL (ref 79–97)
Platelets: 278 x10E3/uL (ref 150–450)
RBC: 5.08 x10E6/uL (ref 4.14–5.80)
RDW: 15.8 % — ABNORMAL HIGH (ref 11.6–15.4)
WBC: 7.1 x10E3/uL (ref 3.4–10.8)

## 2024-02-25 LAB — BASIC METABOLIC PANEL WITH GFR
BUN/Creatinine Ratio: 14 (ref 9–20)
BUN: 30 mg/dL — ABNORMAL HIGH (ref 6–24)
CO2: 17 mmol/L — ABNORMAL LOW (ref 20–29)
Calcium: 9.3 mg/dL (ref 8.7–10.2)
Chloride: 101 mmol/L (ref 96–106)
Creatinine, Ser: 2.2 mg/dL — ABNORMAL HIGH (ref 0.76–1.27)
Glucose: 140 mg/dL — ABNORMAL HIGH (ref 70–99)
Potassium: 5.2 mmol/L (ref 3.5–5.2)
Sodium: 138 mmol/L (ref 134–144)
eGFR: 35 mL/min/1.73 — ABNORMAL LOW (ref >59)

## 2024-03-11 NOTE — Pre-Procedure Instructions (Signed)
 Instructed patient on the following items: Arrival time 0900. New arrival  Nothing to eat or drink after midnight No meds AM of procedure Responsible person to drive you home and stay with you for 24 hrs Wash with special soap night before and morning of procedure  Last Ozempic 10/8

## 2024-03-12 ENCOUNTER — Ambulatory Visit (HOSPITAL_COMMUNITY)

## 2024-03-12 ENCOUNTER — Ambulatory Visit (HOSPITAL_COMMUNITY)
Admission: RE | Disposition: A | Discharge status: Home / Self Care | Payer: Self-pay | Source: Home / Self Care | Attending: Cardiology

## 2024-03-12 ENCOUNTER — Ambulatory Visit (HOSPITAL_COMMUNITY)
Admission: RE | Admit: 2024-03-12 | Discharge: 2024-03-12 | Disposition: A | Discharge status: Home / Self Care | Attending: Cardiology | Admitting: Cardiology

## 2024-03-12 ENCOUNTER — Encounter (HOSPITAL_COMMUNITY): Payer: Self-pay | Admitting: Cardiology

## 2024-03-12 ENCOUNTER — Other Ambulatory Visit: Payer: Self-pay

## 2024-03-12 DIAGNOSIS — Z79899 Other long term (current) drug therapy: Secondary | ICD-10-CM | POA: Insufficient documentation

## 2024-03-12 DIAGNOSIS — I429 Cardiomyopathy, unspecified: Secondary | ICD-10-CM

## 2024-03-12 DIAGNOSIS — E781 Pure hyperglyceridemia: Secondary | ICD-10-CM | POA: Diagnosis not present

## 2024-03-12 DIAGNOSIS — Z720 Tobacco use: Secondary | ICD-10-CM | POA: Insufficient documentation

## 2024-03-12 DIAGNOSIS — Z21 Asymptomatic human immunodeficiency virus [HIV] infection status: Secondary | ICD-10-CM | POA: Insufficient documentation

## 2024-03-12 DIAGNOSIS — I5022 Chronic systolic (congestive) heart failure: Secondary | ICD-10-CM | POA: Diagnosis not present

## 2024-03-12 DIAGNOSIS — I2510 Atherosclerotic heart disease of native coronary artery without angina pectoris: Secondary | ICD-10-CM | POA: Diagnosis not present

## 2024-03-12 DIAGNOSIS — E119 Type 2 diabetes mellitus without complications: Secondary | ICD-10-CM | POA: Diagnosis not present

## 2024-03-12 DIAGNOSIS — I110 Hypertensive heart disease with heart failure: Secondary | ICD-10-CM | POA: Insufficient documentation

## 2024-03-12 DIAGNOSIS — I428 Other cardiomyopathies: Secondary | ICD-10-CM | POA: Diagnosis not present

## 2024-03-12 HISTORY — PX: ICD IMPLANT: EP1208

## 2024-03-12 LAB — GLUCOSE, CAPILLARY
Glucose-Capillary: 132 mg/dL — ABNORMAL HIGH (ref 70–99)
Glucose-Capillary: 116 mg/dL — ABNORMAL HIGH (ref 70–99)

## 2024-03-12 SURGERY — ICD IMPLANT
Anesthesia: LOCAL

## 2024-03-12 MED ORDER — SODIUM CHLORIDE 0.9 % IV SOLN
INTRAVENOUS | Status: AC
  Filled 2024-03-12: qty 2

## 2024-03-12 MED ORDER — ONDANSETRON HCL 4 MG/2ML IJ SOLN: 4.0000 mg | Freq: Four times a day (QID) | INTRAMUSCULAR | Status: DC | PRN

## 2024-03-12 MED ORDER — HEPARIN (PORCINE) IN NACL 1000-0.9 UT/500ML-% IV SOLN
INTRAVENOUS | Status: DC | PRN
  Administered 2024-03-12: 500 mL

## 2024-03-12 MED ORDER — MIDAZOLAM HCL 2 MG/2ML IJ SOLN
INTRAMUSCULAR | Status: AC
Start: 2024-03-12 — End: 2024-03-12
  Filled 2024-03-12: qty 2

## 2024-03-12 MED ORDER — SODIUM CHLORIDE 0.9 % IV SOLN
80.0000 mg | INTRAVENOUS | Status: AC
  Administered 2024-03-12: 80 mg

## 2024-03-12 MED ORDER — LIDOCAINE HCL (PF) 1 % IJ SOLN
INTRAMUSCULAR | Status: AC
  Filled 2024-03-12: qty 60

## 2024-03-12 MED ORDER — MIDAZOLAM HCL 2 MG/2ML IJ SOLN
INTRAMUSCULAR | Status: AC
  Filled 2024-03-12: qty 2

## 2024-03-12 MED ORDER — MIDAZOLAM HCL 5 MG/5ML IJ SOLN
INTRAMUSCULAR | Status: DC | PRN
  Administered 2024-03-12: 1 mg via INTRAVENOUS
  Administered 2024-03-12: 2 mg via INTRAVENOUS

## 2024-03-12 MED ORDER — CEFAZOLIN SODIUM-DEXTROSE 2-4 GM/100ML-% IV SOLN: 2.0000 g | INTRAVENOUS | Status: AC

## 2024-03-12 MED ORDER — POVIDONE-IODINE 10 % EX SWAB: 2.0000 | Freq: Once | CUTANEOUS | Status: DC

## 2024-03-12 MED ORDER — SODIUM CHLORIDE 0.9 % IV SOLN: INTRAVENOUS | Status: DC

## 2024-03-12 MED ORDER — CHLORHEXIDINE GLUCONATE 4 % EX SOLN
4.0000 | Freq: Once | CUTANEOUS | Status: DC
  Filled 2024-03-12: qty 60

## 2024-03-12 MED ORDER — ACETAMINOPHEN 325 MG PO TABS
325.0000 mg | ORAL_TABLET | ORAL | Status: DC | PRN
  Administered 2024-03-12: 650 mg via ORAL
  Filled 2024-03-12: qty 2

## 2024-03-12 MED ORDER — FENTANYL CITRATE (PF) 100 MCG/2ML IJ SOLN
INTRAMUSCULAR | Status: DC | PRN
  Administered 2024-03-12 (×2): 25 ug via INTRAVENOUS

## 2024-03-12 MED ORDER — CEFAZOLIN SODIUM-DEXTROSE 2-4 GM/100ML-% IV SOLN
INTRAVENOUS | Status: AC
  Administered 2024-03-12: 2 g via INTRAVENOUS
  Filled 2024-03-12: qty 100

## 2024-03-12 MED ORDER — FENTANYL CITRATE (PF) 100 MCG/2ML IJ SOLN
INTRAMUSCULAR | Status: AC
  Filled 2024-03-12: qty 2

## 2024-03-12 SURGICAL SUPPLY — 8 items
CABLE SURGICAL S-101-97-12 (CABLE) ×1 IMPLANT
ICD COBALT XT VR DVPA2D4 (ICD Generator) IMPLANT
KIT INSTRUMENT PACEMAKER INSER (INSTRUMENTS) IMPLANT
LEAD SPRINT QUAT SEC 6935M-62 (Lead) IMPLANT
PAD DEFIB RADIO PHYSIO CONN (PAD) ×1 IMPLANT
SHEATH 9FR PRELUDE SNAP 13 (SHEATH) IMPLANT
SHEATH PROBE COVER 6X72 (BAG) IMPLANT
TRAY PACEMAKER INSERTION (PACKS) ×1 IMPLANT

## 2024-03-12 NOTE — Progress Notes (Addendum)
 Patient and patient family member given discharge instructions, education provided no further questions at this time. Patient able to ambulate and void before discharge. Able to tolerate PO intake. Patient site is clean, dry, intact and soft upon discharge. IV removed. Spoke with MD Camnitz in regards to Chest x-ray, states it looks good and patient is clear for discharge at 1616.

## 2024-03-12 NOTE — H&P (Signed)
  Electrophysiology Office Note:   Date:  03/12/2024  ID:  Kevin Burke, DOB 1970-08-13, MRN [id number]  Primary Cardiologist: Alvan Carrier, MD Primary Heart Failure: None Electrophysiologist: Vanissa Strength Gladis Norton, MD      History of Present Illness:   Kevin Burke is a 53 y.o. male with h/o chronic systolic heart failure, hypertension, hypertriglyceridemia, HIV, tobacco abuse, diabetes seen today for routine electrophysiology followup.   Today, denies symptoms of palpitations, chest pain, dyspnea, orthopnea, PND, lower extremity edema, claudication, dizziness, presyncope, syncope, bleeding, or neurologic sequela. The patient is tolerating medications without difficulties. Plan ICD implant today.   EP Information / Studies Reviewed:    EKG is not ordered today. EKG from 08/10/2023 reviewed which showed sinus rhythm, LVH      Risk Assessment/Calculations:           Physical Exam:   VS:  BP (!) 132/102   Pulse 92   Temp 98.7 F (37.1 C) (Oral)   Resp 17   Ht 5' 6 (1.676 m)   Wt 72.6 kg   SpO2 98%   BMI 25.82 kg/m    Wt Readings from Last 3 Encounters:  03/12/24 72.6 kg  02/13/24 74.8 kg  12/31/23 75.8 kg    GEN: Well nourished, well developed in no acute distress NECK: No JVD; No carotid bruits CARDIAC: Regular rate and rhythm, no murmurs, rubs, gallops RESPIRATORY:  Clear to auscultation without rales, wheezing or rhonchi  ABDOMEN: Soft, non-tender, non-distended EXTREMITIES:  No edema; No deformity    ASSESSMENT AND PLAN:    1.  Chronic systolic heart failure: Kevin Burke has presented today for surgery, with the diagnosis of CHF.  The various methods of treatment have been discussed with the patient and family. After consideration of risks, benefits and other options for treatment, the patient has consented to  Procedure(s): ICD implant as a surgical intervention .  Risks include but not limited to bleeding, infection, pneumothorax, perforation, tamponade, vascular  damage, renal failure, MI, stroke, death, and lead dislodgement . The patient's history has been reviewed, patient examined, no change in status, stable for surgery.  I have reviewed the patient's chart and labs.  Questions were answered to the patient's satisfaction.    Detria Cummings Norton, MD 03/12/2024 9:24 AM

## 2024-03-12 NOTE — Discharge Instructions (Signed)
 After Your ICD (Implantable Cardiac Defibrillator)   You have a Medtronic ICD  If you have a Medtronic or Biotronik device, plug in your home monitor once you get home, and no manual interaction is required.   If you have an [employer] or [employer] device, plug your home monitor once you get home, sit near the device, and press the large activation button. Sit nearby until the process is complete, usually notated by lights on the monitor.   If you were set up for monitoring using an app on your phone, make sure the app remains open in the background and the Bluetooth remains on.  ACTIVITY Do not lift your arm above shoulder height for 1 week after your procedure. After 7 days, you may progress as below.  You should remove your sling 24 hours after your procedure, unless otherwise instructed by your provider.     Thursday March 19, 2024  Friday March 20, 2024 Saturday March 21, 2024 Sunday March 22, 2024   Do not lift, push, pull, or carry anything over 10 pounds with the affected arm until 6 weeks (Thursday April 23, 2024 ) after your procedure.   You may drive AFTER your wound check, unless you have been told otherwise by your provider.   Ask your healthcare provider when you can go back to work   INCISION/Dressing If you are on a blood thinner such as Coumadin, Xarelto, Eliquis, Plavix, or Pradaxa please confirm with your provider when this should be resumed.   If large square, outer bandage is left in place, this can be removed after 24 hours from your procedure. Do not remove steri-strips or glue as below.   Monitor your defibrillator site for redness, swelling, and drainage. Call the device clinic at [phone] if you experience these symptoms or fever/chills.  If your incision is sealed with Steri-strips or staples, you may shower 7 days after your procedure or when told by your provider. Do not remove the steri-strips or let the shower hit directly on your  site. You may wash around your site with soap and water.    If you were discharged in a sling, please do not wear this during the day more than 48 hours after your surgery unless otherwise instructed. This may increase the risk of stiffness and soreness in your shoulder.   Avoid lotions, ointments, or perfumes over your incision until it is well-healed.  You may use a hot tub or a pool AFTER your wound check appointment if the incision is completely closed.  Your ICD is designed to protect you from life threatening heart rhythms. Because of this, you may receive a shock.   1 shock with no symptoms:  Call the office during business hours. 1 shock with symptoms (chest pain, chest pressure, dizziness, lightheadedness, shortness of breath, overall feeling unwell):  Call 911. If you experience 2 or more shocks in 24 hours:  Call 911. If you receive a shock, you should not drive for 6 months per the [place] DMV IF you receive appropriate therapy from your ICD.   ICD Alerts:  Some alerts are vibratory and others beep. These are NOT emergencies. Please call our office to let us  know. If this occurs at night or on weekends, it can wait until the next business day. Send a remote transmission.  If your device is capable of reading fluid status (for heart failure), you will be offered monthly monitoring to review this with you.   DEVICE MANAGEMENT Remote monitoring  is used to monitor your ICD from home. This monitoring is scheduled every 91 days by our office. It allows us  to keep an eye on the functioning of your device to ensure it is working properly. You will routinely see your Electrophysiologist annually (more often if necessary). This will appear as a REMOTE check on your MyChart schedule. These are automatic and there is nothing for you to manually do unless otherwise instructed.  You should receive your ID card for your new device in 4-8 weeks. Keep this card with you at all times once received.  Consider wearing a medical alert bracelet or necklace.  Your ICD  may be MRI compatible. This will be discussed at your next office visit/wound check.  You should avoid contact with strong electric or magnetic fields.   Do not use amateur (ham) radio equipment or electric (arc) welding torches. MP3 player headphones with magnets should not be used. Some devices are safe to use if held at least 12 inches (30 cm) from your defibrillator. These include power tools, lawn mowers, and speakers. If you are unsure if something is safe to use, ask your health care provider.  When using your cell phone, hold it to the ear that is on the opposite side from the defibrillator. Do not leave your cell phone in a pocket over the defibrillator.  You may safely use electric blankets, heating pads, computers, and microwave ovens.  Call the office right away if: You have chest pain. You feel more than one shock. You feel more short of breath than you have felt before. You feel more light-headed than you have felt before. Your incision starts to open up.  This information is not intended to replace advice given to you by your health care provider. Make sure you discuss any questions you have with your health care provider.

## 2024-03-13 DIAGNOSIS — Z0279 Encounter for issue of other medical certificate: Secondary | ICD-10-CM

## 2024-03-13 MED FILL — Lidocaine HCl Local Preservative Free (PF) Inj 1%: INTRAMUSCULAR | Qty: 30 | Status: AC

## 2024-03-13 MED FILL — Midazolam HCl Inj 2 MG/2ML (Base Equivalent): INTRAMUSCULAR | Qty: 3 | Status: AC

## 2024-03-25 ENCOUNTER — Ambulatory Visit: Attending: Cardiology

## 2024-03-25 DIAGNOSIS — I509 Heart failure, unspecified: Secondary | ICD-10-CM

## 2024-03-25 NOTE — Progress Notes (Signed)
 Normal single chamber ICD wound check. Wound well healed. Presenting rhythm: VS 95. Routine testing performed. Thresholds, sensing, and impedance consistent with implant measurements with 3.5V safety margin/auto capture until 3 month visit. No treated arrhythmias. Reviewed arm restrictions to continue for 6 weeks total post op. Reviewed shock plan.  Pt enrolled in remote follow-up.

## 2024-03-25 NOTE — Patient Instructions (Signed)

## 2024-03-26 LAB — CUP PACEART INCLINIC DEVICE CHECK
Date Time Interrogation Session: 20251029123309
Implantable Lead Connection Status: 753985
Implantable Lead Implant Date: 20251016
Implantable Lead Location: 753860
Implantable Pulse Generator Implant Date: 20251016

## 2024-03-27 ENCOUNTER — Ambulatory Visit: Payer: Self-pay | Admitting: Cardiology

## 2024-04-08 ENCOUNTER — Other Ambulatory Visit (HOSPITAL_COMMUNITY): Payer: Self-pay

## 2024-04-08 ENCOUNTER — Other Ambulatory Visit: Payer: Self-pay

## 2024-04-08 NOTE — Progress Notes (Signed)
 Specialty Pharmacy Refill Coordination Note  Kevin Burke is a 53 y.o. male assessed today regarding refills of clinic administered specialty medication(s) Cabotegravir  & Rilpivirine  (CABENUVA )   Clinic requested Courier to Provider Office   Delivery date: 04/16/24   Verified address: [address] [place] [address]   Medication will be filled on 04/15/24.

## 2024-04-15 ENCOUNTER — Other Ambulatory Visit: Payer: Self-pay

## 2024-04-16 ENCOUNTER — Telehealth: Payer: Self-pay

## 2024-04-16 NOTE — Telephone Encounter (Signed)
 [facility] Patient Advocate Encounter  Patient's medications CABENUVA  have been couriered to [facility] from [facility] and will be administered at the patients appointment on 04/21/24.  Charmaine Sharps, CPhT Specialty Pharmacy Patient [facility] Phone: [phone] Fax:  [phone]

## 2024-04-20 NOTE — Progress Notes (Unsigned)
 HPI: Kevin Burke is a 53 y.o. male who presents to the [facility] pharmacy clinic for Cabenuva  administration.  Referring ID Provider: Dr. Overton   Patient Active Problem List   Diagnosis Date Noted   Acute kidney injury superimposed on chronic kidney disease 08/10/2023   Chronic heart failure with reduced ejection fraction (HFrEF, <= 40%) (HCC) 08/10/2023   CHF (congestive heart failure) (HCC)    Hypogonadism in male    Mixed hyperlipidemia    Occlusion of left vertebral artery    Sleep disorder    Type 2 diabetes mellitus (HCC)    Vitamin D deficiency    Need for prophylactic vaccination and inoculation against influenza 02/14/2023   COVID-19 virus infection 02/12/2022   Tobacco abuse 09/13/2021   Routine screening for STI (sexually transmitted infection) 07/12/2021   Hypertriglyceridemia 08/18/2020   Hypertension associated with diabetes (HCC) 08/17/2020   Human immunodeficiency virus (HIV) disease (HCC) 07/29/2020   DM type 2, uncontrolled, with renal complications 07/29/2020    Patient's Medications  New Prescriptions   No medications on file  Previous Medications   CABOTEGRAVIR  & RILPIVIRINE  ER (CABENUVA ) 600 & 900 MG/3ML INJECTION    Inject 1 kit into the muscle every 2 (two) months.   CARVEDILOL  (COREG ) 25 MG TABLET    Take 1 tablet (25 mg total) by mouth 2 (two) times daily with a meal.   CHOLECALCIFEROL (VITAMIN D3) 125 MCG (5000 UT) TABS    Take 5,000 Units by mouth every other day.   EPLERENONE (INSPRA) 25 MG TABLET    Take 25 mg by mouth daily.   FENOFIBRATE  160 MG TABLET    Take 160 mg by mouth daily.   FLUTICASONE (FLONASE) 50 MCG/ACT NASAL SPRAY    Place 1 spray into both nostrils daily as needed for allergies.   FUROSEMIDE (LASIX) 40 MG TABLET    Take 1 tablet by mouth daily as needed for edema or fluid.   NIFEDIPINE  (ADALAT  CC) 30 MG 24 HR TABLET    Take 30 mg by mouth daily.   OZEMPIC, 2 MG/DOSE, 8 MG/3ML SOPN    Inject 2 mg into the skin once a week.   TRAZODONE  (DESYREL) 50 MG TABLET    Take 50 mg by mouth at bedtime as needed for sleep.  Modified Medications   No medications on file  Discontinued Medications   No medications on file    Allergies: Allergies  Allergen Reactions   Bactrim Ds [Sulfamethoxazole-Trimethoprim] Hives    Hives/ itching    Dapsone Hives and Itching    Labs: Lab Results  Component Value Date   HIV1RNAQUANT NOT DETECTED 02/13/2024   HIV1RNAQUANT <20 DETECTED (A) 08/19/2023   HIV1RNAQUANT Not Detected 02/14/2023   CD4TABS 238 (L) 08/19/2023   CD4TABS 163 (L) 02/14/2023   CD4TABS 234 (L) 10/24/2022    RPR and STI Lab Results  Component Value Date   LABRPR NON-REACTIVE 08/19/2023   LABRPR NON-REACTIVE 07/12/2021   LABRPR CANCELED 07/27/2020    STI Results GC CT  07/27/2020  4:08 PM Negative  Negative     Hepatitis B Lab Results  Component Value Date   HEPBSAB NON-REACTIVE 04/16/2022   HEPBSAG NON-REACTIVE 02/13/2024   HEPBCAB NON-REACTIVE 02/13/2024   Hepatitis C Lab Results  Component Value Date   HEPCAB NON-REACTIVE 07/27/2020   Hepatitis A Lab Results  Component Value Date   HAV REACTIVE (A) 04/16/2022   Lipids: Lab Results  Component Value Date   CHOL 319 (H)  04/16/2022   TRIG 2,552 (H) 04/16/2022   HDL 30 (L) 04/16/2022   CHOLHDL 10.6 (H) 04/16/2022   LDLCALC  04/16/2022     Comment:     . LDL cholesterol not calculated. Triglyceride levels greater than 400 mg/dL invalidate calculated LDL results. . Reference range: <100 . Desirable range <100 mg/dL for primary prevention;   <70 mg/dL for patients with CHD or diabetic patients  with > or = 2 CHD risk factors. SABRA LDL-C is now calculated using the Martin-Hopkins  calculation, which is a validated novel method providing  better accuracy than the Friedewald equation in the  estimation of LDL-C.  Gladis APPLETHWAITE et al. SANDREA. 7986;689(80): 2061-2068  (http://education.[employer].com/faq/FAQ164)     Target Date: 25th    Assessment: Kevin Burke presents today for his maintenance Cabenuva  injections. Past injections were tolerated well without issues. Last HIV RNA was undetectable on 02/13/2024. Doing well with no issues today.  Lab work:  *** CD4 count (consider if getting lab draws), oral/urine/rectal cytologies for GC/Chlamydia, RPR ***   Eligible vaccinations: Covid, Heplisav 1/2, Shingrix 2/2  ***  Cabenuva : Administered cabotegravir  600mg /68mL in left upper outer quadrant of the gluteal muscle. Administered rilpivirine  900 mg/3mL in the right upper outer quadrant of the gluteal muscle. No issues with injections. Kevin Burke will follow up in 2 months for next set of injections.  Plan: - Cabenuva  injections administered - CD4 count, oral/urine/rectal cytologies for GC/Chlamydia, RPR ***  - Next injections scheduled for ***1/19-1/30, then 3/18-4/1 - Call with any issues or questions  Kevin Burke, PharmD PGY2 Infectious Diseases Pharmacy Resident

## 2024-04-21 ENCOUNTER — Ambulatory Visit: Payer: Self-pay | Admitting: Pharmacist

## 2024-04-21 ENCOUNTER — Other Ambulatory Visit: Payer: Self-pay

## 2024-04-21 DIAGNOSIS — Z113 Encounter for screening for infections with a predominantly sexual mode of transmission: Secondary | ICD-10-CM

## 2024-04-21 DIAGNOSIS — B20 Human immunodeficiency virus [HIV] disease: Secondary | ICD-10-CM

## 2024-04-21 MED ORDER — CABOTEGRAVIR & RILPIVIRINE ER 600 & 900 MG/3ML IM SUER
1.0000 | Freq: Once | INTRAMUSCULAR | Status: AC
  Administered 2024-04-21: 1 via INTRAMUSCULAR

## 2024-04-21 NOTE — Progress Notes (Signed)
 The 10-year ASCVD risk score (Arnett DK, et al., 2019) is: 45.5%   Values used to calculate the score:     Age: 53 years     Clincally relevant sex: Male     Is Non-Hispanic African American: Yes     Diabetic: Yes     Tobacco smoker: Yes     Systolic Blood Pressure: 146 mmHg     Is BP treated: Yes     HDL Cholesterol: 30 mg/dL     Total Cholesterol: 319 mg/dL  NO CURRENT STATIN THERAPY.   Byran Bilotti, BSN, RN

## 2024-04-23 ENCOUNTER — Ambulatory Visit: Attending: Cardiology

## 2024-04-23 ENCOUNTER — Ambulatory Visit

## 2024-04-23 DIAGNOSIS — I5022 Chronic systolic (congestive) heart failure: Secondary | ICD-10-CM

## 2024-04-25 LAB — CUP PACEART REMOTE DEVICE CHECK
Battery Remaining Longevity: 168 mo
Battery Voltage: 3.15 V
Brady Statistic RA Percent Paced: INVALID
Brady Statistic RV Percent Paced: 0 %
Date Time Interrogation Session: 20251126193243
HighPow Impedance: 62 Ohm
Implantable Lead Connection Status: 753985
Implantable Lead Implant Date: 20251016
Implantable Lead Location: 753860
Implantable Pulse Generator Implant Date: 20251016
Lead Channel Impedance Value: 304 Ohm
Lead Channel Impedance Value: 399 Ohm
Lead Channel Pacing Threshold Amplitude: 0.625 V
Lead Channel Pacing Threshold Pulse Width: 0.4 ms
Lead Channel Sensing Intrinsic Amplitude: 21.5 mV
Lead Channel Setting Pacing Amplitude: 3.5 V
Lead Channel Setting Pacing Pulse Width: 0.4 ms
Lead Channel Setting Sensing Sensitivity: 0.3 mV
Zone Setting Status: 755011

## 2024-04-27 ENCOUNTER — Ambulatory Visit: Payer: Self-pay | Admitting: Cardiology

## 2024-04-28 NOTE — Progress Notes (Signed)
 Remote ICD Transmission

## 2024-06-08 ENCOUNTER — Other Ambulatory Visit: Payer: Self-pay

## 2024-06-08 ENCOUNTER — Telehealth: Payer: Self-pay

## 2024-06-08 ENCOUNTER — Other Ambulatory Visit (HOSPITAL_COMMUNITY): Payer: Self-pay

## 2024-06-08 NOTE — Telephone Encounter (Signed)
 Pharmacy Patient Advocate Encounter   Received notification from [employer] that prior authorization for CABENUVA  is required/requested.   Insurance verification completed.   The patient is insured through [facility].   Per test claim: PA required; PA submitted to above mentioned insurance via [employer] Key/confirmation #/EOC [id number] Status is pending

## 2024-06-08 NOTE — Progress Notes (Signed)
 Specialty Pharmacy Refill Coordination Note  Kevin Burke is a 55 y.o. male assessed today regarding refills of clinic administered specialty medication(s) Cabotegravir  & Rilpivirine  (CABENUVA )   Clinic requested Courier to Provider Office   Delivery date: 06/15/24   Verified address: [address] [place] [address]   Medication will be filled on 06/12/24.

## 2024-06-09 NOTE — Telephone Encounter (Signed)
 Pharmacy Patient Advocate Encounter  Received notification from [employer] that Prior Authorization for CABENUVA  has been CANCELLED due to PA not needed    PA #/Case ID/Reference #:

## 2024-06-11 ENCOUNTER — Ambulatory Visit: Admitting: Physician Assistant

## 2024-06-12 ENCOUNTER — Other Ambulatory Visit: Payer: Self-pay

## 2024-06-15 ENCOUNTER — Telehealth: Payer: Self-pay

## 2024-06-15 NOTE — Telephone Encounter (Signed)
 [facility] Patient Advocate Encounter  Patient's medications Cabenuva  have been couriered to [facility] from [facility] and will be administered at the patients appointment on 06/24/24.  Arland Hutchinson, CPhT Specialty Pharmacy Patient [facility] Phone: [phone] Fax:  [phone]

## 2024-06-24 ENCOUNTER — Ambulatory Visit: Admitting: Pharmacist

## 2024-06-28 ENCOUNTER — Encounter: Payer: Self-pay | Admitting: Pharmacist

## 2024-06-29 ENCOUNTER — Ambulatory Visit: Admitting: Pharmacist

## 2024-06-30 ENCOUNTER — Other Ambulatory Visit: Payer: Self-pay

## 2024-06-30 ENCOUNTER — Ambulatory Visit: Admitting: Pharmacist

## 2024-06-30 DIAGNOSIS — B20 Human immunodeficiency virus [HIV] disease: Secondary | ICD-10-CM

## 2024-06-30 MED ORDER — CABOTEGRAVIR & RILPIVIRINE ER 600 & 900 MG/3ML IM SUER
1.0000 | Freq: Once | INTRAMUSCULAR | Status: AC
  Administered 2024-06-30: 1 via INTRAMUSCULAR

## 2024-07-23 ENCOUNTER — Ambulatory Visit

## 2024-07-24 ENCOUNTER — Ambulatory Visit

## 2024-08-12 ENCOUNTER — Ambulatory Visit: Admitting: Internal Medicine

## 2024-10-22 ENCOUNTER — Ambulatory Visit

## 2024-10-23 ENCOUNTER — Ambulatory Visit

## 2025-01-21 ENCOUNTER — Ambulatory Visit

## 2025-01-22 ENCOUNTER — Ambulatory Visit
